# Patient Record
Sex: Male | Born: 1970 | Race: White | Hispanic: No | Marital: Single | Smoking: Current every day smoker
Health system: Southern US, Community
[De-identification: ages and names within clinical notes are randomized; demographics above are authoritative.]

## PROBLEM LIST (undated history)

## (undated) DIAGNOSIS — K900 Celiac disease: Secondary | ICD-10-CM

## (undated) DIAGNOSIS — E785 Hyperlipidemia, unspecified: Secondary | ICD-10-CM

## (undated) DIAGNOSIS — E119 Type 2 diabetes mellitus without complications: Secondary | ICD-10-CM

## (undated) HISTORY — DX: Hyperlipidemia, unspecified: E78.5

## (undated) HISTORY — DX: Type 2 diabetes mellitus without complications: E11.9

## (undated) HISTORY — PX: CERVICAL DISC SURGERY: SHX588

## (undated) HISTORY — DX: Celiac disease: K90.0

---

## 2005-04-17 ENCOUNTER — Emergency Department: Payer: Self-pay | Admitting: Emergency Medicine

## 2005-05-15 ENCOUNTER — Ambulatory Visit (HOSPITAL_COMMUNITY): Admission: RE | Admit: 2005-05-15 | Discharge: 2005-05-16 | Payer: Self-pay | Admitting: Neurosurgery

## 2005-06-10 ENCOUNTER — Encounter: Admission: RE | Admit: 2005-06-10 | Discharge: 2005-06-10 | Payer: Self-pay | Admitting: Neurosurgery

## 2012-05-26 ENCOUNTER — Ambulatory Visit: Payer: Self-pay | Admitting: Family Medicine

## 2012-06-24 ENCOUNTER — Ambulatory Visit: Payer: Self-pay | Admitting: Family Medicine

## 2013-09-23 ENCOUNTER — Ambulatory Visit: Payer: Self-pay | Admitting: Family Medicine

## 2015-05-31 DIAGNOSIS — E785 Hyperlipidemia, unspecified: Secondary | ICD-10-CM | POA: Insufficient documentation

## 2015-05-31 DIAGNOSIS — E109 Type 1 diabetes mellitus without complications: Secondary | ICD-10-CM | POA: Insufficient documentation

## 2015-05-31 DIAGNOSIS — K900 Celiac disease: Secondary | ICD-10-CM | POA: Insufficient documentation

## 2015-06-04 ENCOUNTER — Encounter: Payer: Self-pay | Admitting: Family Medicine

## 2015-06-04 ENCOUNTER — Ambulatory Visit (INDEPENDENT_AMBULATORY_CARE_PROVIDER_SITE_OTHER): Payer: BLUE CROSS/BLUE SHIELD | Admitting: Family Medicine

## 2015-06-04 VITALS — BP 107/75 | HR 66 | Temp 98.0°F | Ht 71.1 in | Wt 161.0 lb

## 2015-06-04 DIAGNOSIS — K900 Celiac disease: Secondary | ICD-10-CM | POA: Diagnosis not present

## 2015-06-04 DIAGNOSIS — E784 Other hyperlipidemia: Secondary | ICD-10-CM

## 2015-06-04 DIAGNOSIS — E7849 Other hyperlipidemia: Secondary | ICD-10-CM

## 2015-06-04 DIAGNOSIS — E785 Hyperlipidemia, unspecified: Secondary | ICD-10-CM

## 2015-06-04 DIAGNOSIS — E109 Type 1 diabetes mellitus without complications: Secondary | ICD-10-CM

## 2015-06-04 LAB — LP+ALT+AST PICCOLO, WAIVED
ALT (SGPT) Piccolo, Waived: 16 U/L (ref 10–47)
AST (SGOT) Piccolo, Waived: 28 U/L (ref 11–38)
CHOL/HDL RATIO PICCOLO,WAIVE: 3.5 mg/dL
CHOLESTEROL PICCOLO, WAIVED: 145 mg/dL (ref <200)
HDL Chol Piccolo, Waived: 41 mg/dL — ABNORMAL LOW (ref >59)
LDL Chol Calc Piccolo Waived: 78 mg/dL (ref <100)
Triglycerides Piccolo,Waived: 128 mg/dL (ref <150)
VLDL CHOL CALC PICCOLO,WAIVE: 26 mg/dL (ref <30)

## 2015-06-04 LAB — BAYER DCA HB A1C WAIVED: HB A1C (BAYER DCA - WAIVED): 7.3 % — ABNORMAL HIGH (ref <7.0)

## 2015-06-04 MED ORDER — INSULIN ASPART 100 UNIT/ML FLEXPEN: 20.0000 [IU] | PEN_INJECTOR | Freq: Three times a day (TID) | SUBCUTANEOUS | Status: DC

## 2015-06-04 MED ORDER — INSULIN GLARGINE 300 UNIT/ML ~~LOC~~ SOPN: 25.0000 [IU] | PEN_INJECTOR | Freq: Every day | SUBCUTANEOUS | Status: DC | PRN

## 2015-06-04 NOTE — Assessment & Plan Note (Signed)
Diet controled

## 2015-06-04 NOTE — Progress Notes (Signed)
   BP 107/75 mmHg  Pulse 66  Temp(Src) 98 F (36.7 C)  Ht 5' 11.1" (1.806 m)  Wt 161 lb (73.029 kg)  BMI 22.39 kg/m2  SpO2 97%   Subjective:    Patient ID: Dustin Ray, male    DOB: August 08, 1971, 44 y.o.   MRN: [id number]  HPI: Dustin Ray is a 44 y.o. male  Chief Complaint  Patient presents with  . Diabetes  . Hyperlipidemia  doing well No low BS issues Taking meds well No side effects Lipids doing well Also wants DMV papers done Reviewed and has been taken off medical review.  Relevant past medical, surgical, family and social history reviewed and updated as indicated. Interim medical history since our last visit reviewed. Allergies and medications reviewed and updated.  Review of Systems  Constitutional: Negative.   Respiratory: Negative.   Cardiovascular: Negative.     Per HPI unless specifically indicated above     Objective:    BP 107/75 mmHg  Pulse 66  Temp(Src) 98 F (36.7 C)  Ht 5' 11.1" (1.806 m)  Wt 161 lb (73.029 kg)  BMI 22.39 kg/m2  SpO2 97%  Wt Readings from Last 3 Encounters:  06/04/15 161 lb (73.029 kg)  03/01/15 151 lb (68.493 kg)    Physical Exam  Constitutional: He is oriented to person, place, and time. He appears well-developed and well-nourished. No distress.  HENT:  Head: Normocephalic and atraumatic.  Right Ear: Hearing normal.  Left Ear: Hearing normal.  Nose: Nose normal.  Eyes: Conjunctivae and lids are normal. Right eye exhibits no discharge. Left eye exhibits no discharge. No scleral icterus.  Cardiovascular: Normal rate, regular rhythm and normal heart sounds.   Pulmonary/Chest: Effort normal and breath sounds normal. No respiratory distress.  Musculoskeletal: Normal range of motion.  Neurological: He is alert and oriented to person, place, and time.  Skin: Skin is intact. No rash noted.  Psychiatric: He has a normal mood and affect. His speech is normal and behavior is normal. Judgment and thought content normal.  Cognition and memory are normal.    No results found for this or any previous visit.    Assessment & Plan:   Problem List Items Addressed This Visit      Digestive   Celiac disease    The current medical regimen is effective;  continue present plan and medications.         Endocrine   Diabetes - Primary    The current medical regimen is effective;  continue present plan and medications. Cont diet and exercise      Relevant Medications   insulin aspart (NOVOLOG FLEXPEN) 100 UNIT/ML FlexPen   Insulin Glargine (TOUJEO SOLOSTAR) 300 UNIT/ML SOPN   Other Relevant Orders   Bayer DCA Hb A1c Waived   Basic metabolic panel   LP+ALT+AST Piccolo, Waived     Other   Hyperlipidemia    Diet controled       Other Visit Diagnoses    Familial combined hyperlipidemia        Relevant Orders    Bayer DCA Hb A1c Waived    Basic metabolic panel    LP+ALT+AST Piccolo, Waived        Follow up plan: Return in about 3 months (around 09/04/2015) for check A1C.

## 2015-06-04 NOTE — Assessment & Plan Note (Signed)
The current medical regimen is effective;  continue present plan and medications.  

## 2015-06-04 NOTE — Assessment & Plan Note (Signed)
The current medical regimen is effective;  continue present plan and medications. Cont diet and exercise

## 2015-06-05 LAB — BASIC METABOLIC PANEL
BUN/Creatinine Ratio: 18 (ref 9–20)
BUN: 19 mg/dL (ref 6–24)
CO2: 28 mmol/L (ref 18–29)
Calcium: 9.5 mg/dL (ref 8.7–10.2)
Chloride: 102 mmol/L (ref 97–108)
Creatinine, Ser: 1.08 mg/dL (ref 0.76–1.27)
GFR calc Af Amer: 95 mL/min/{1.73_m2} (ref >59)
GFR calc non Af Amer: 82 mL/min/{1.73_m2} (ref >59)
Glucose: 52 mg/dL — ABNORMAL LOW (ref 65–99)
Potassium: 3.7 mmol/L (ref 3.5–5.2)
Sodium: 144 mmol/L (ref 134–144)

## 2015-07-16 ENCOUNTER — Other Ambulatory Visit: Payer: Self-pay | Admitting: Family Medicine

## 2015-09-03 ENCOUNTER — Ambulatory Visit (INDEPENDENT_AMBULATORY_CARE_PROVIDER_SITE_OTHER): Payer: BLUE CROSS/BLUE SHIELD | Admitting: Family Medicine

## 2015-09-03 ENCOUNTER — Encounter: Payer: Self-pay | Admitting: Family Medicine

## 2015-09-03 VITALS — BP 112/69 | HR 65 | Temp 97.6°F | Ht 71.0 in | Wt 160.0 lb

## 2015-09-03 DIAGNOSIS — E785 Hyperlipidemia, unspecified: Secondary | ICD-10-CM | POA: Diagnosis not present

## 2015-09-03 DIAGNOSIS — J019 Acute sinusitis, unspecified: Secondary | ICD-10-CM | POA: Diagnosis not present

## 2015-09-03 DIAGNOSIS — E109 Type 1 diabetes mellitus without complications: Secondary | ICD-10-CM

## 2015-09-03 DIAGNOSIS — K900 Celiac disease: Secondary | ICD-10-CM | POA: Diagnosis not present

## 2015-09-03 DIAGNOSIS — J329 Chronic sinusitis, unspecified: Secondary | ICD-10-CM | POA: Insufficient documentation

## 2015-09-03 LAB — BAYER DCA HB A1C WAIVED: HB A1C: 7.6 % — AB (ref <7.0)

## 2015-09-03 MED ORDER — AMOXICILLIN 875 MG PO TABS: 875.0000 mg | ORAL_TABLET | Freq: Two times a day (BID) | ORAL | Status: DC

## 2015-09-03 NOTE — Assessment & Plan Note (Signed)
The current medical regimen is effective;  continue present plan and medications.  

## 2015-09-03 NOTE — Progress Notes (Signed)
BP 112/69 mmHg  Pulse 65  Temp(Src) 97.6 F (36.4 C)  Ht 5' 11"  (1.803 m)  Wt 160 lb (72.576 kg)  BMI 22.33 kg/m2  SpO2 97%   Subjective:    Patient ID: Dustin Ray, male    DOB: February 04, 1971, 44 y.o.   MRN: [id number]  HPI: Dustin Ray is a 44 y.o. male  Chief Complaint  Patient presents with  . Diabetes  . URI   patient's diabetes has been doing okay except for the last 2 weeks with this upper respiratory infection and sinus congestion and drainage and facial pressure no fevers. His been getting worse. Has been taking over-the-counter medications without help. Patient didn't already have this visit he would come in anyway.  Other medical illnesses stable  Colitis and hypercholesterol doing well.  Relevant past medical, surgical, family and social history reviewed and updated as indicated. Interim medical history since our last visit reviewed. Allergies and medications reviewed and updated.  Review of Systems  Constitutional: Positive for fatigue. Negative for fever.  HENT: Positive for congestion, facial swelling, rhinorrhea, sinus pressure, sneezing and sore throat.   Respiratory: Positive for cough.   Cardiovascular: Negative.   Gastrointestinal: Negative.     Per HPI unless specifically indicated above     Objective:    BP 112/69 mmHg  Pulse 65  Temp(Src) 97.6 F (36.4 C)  Ht 5' 11"  (1.803 m)  Wt 160 lb (72.576 kg)  BMI 22.33 kg/m2  SpO2 97%  Wt Readings from Last 3 Encounters:  09/03/15 160 lb (72.576 kg)  06/04/15 161 lb (73.029 kg)  03/01/15 151 lb (68.493 kg)    Physical Exam  Constitutional: He is oriented to person, place, and time. He appears well-developed and well-nourished. No distress.  HENT:  Head: Normocephalic and atraumatic.  Right Ear: Hearing and external ear normal.  Left Ear: Hearing and external ear normal.  Nose: Nose normal.  Mouth/Throat: Oropharyngeal exudate present.  Throat inflamed with sinusitis drainage  Eyes:  Conjunctivae and lids are normal. Right eye exhibits no discharge. Left eye exhibits no discharge. No scleral icterus.  Cardiovascular: Normal rate, regular rhythm and normal heart sounds.   Pulmonary/Chest: Effort normal and breath sounds normal. No respiratory distress.  Musculoskeletal: Normal range of motion.  Lymphadenopathy:    He has no cervical adenopathy.  Neurological: He is alert and oriented to person, place, and time.  Skin: Skin is intact. No rash noted.  Psychiatric: He has a normal mood and affect. His speech is normal and behavior is normal. Judgment and thought content normal. Cognition and memory are normal.    Results for orders placed or performed in visit on 06/04/15  Bayer DCA Hb A1c Waived  Result Value Ref Range   Bayer DCA Hb A1c Waived 7.3 (H) <4.9 %  Basic metabolic panel  Result Value Ref Range   Glucose 52 (L) 65 - 99 mg/dL   BUN 19 6 - 24 mg/dL   Creatinine, Ser 1.08 0.76 - 1.27 mg/dL   GFR calc non Af Amer 82 >59 mL/min/1.73   GFR calc Af Amer 95 >59 mL/min/1.73   BUN/Creatinine Ratio 18 9 - 20   Sodium 144 134 - 144 mmol/L   Potassium 3.7 3.5 - 5.2 mmol/L   Chloride 102 97 - 108 mmol/L   CO2 28 18 - 29 mmol/L   Calcium 9.5 8.7 - 10.2 mg/dL  LP+ALT+AST Piccolo, Waived  Result Value Ref Range   ALT (SGPT) Piccolo, [employer]  16 10 - 47 U/L   AST (SGOT) Piccolo, Waived 28 11 - 38 U/L   Cholesterol Piccolo, Waived 145 <200 mg/dL   HDL Chol Piccolo, Waived 41 (L) >59 mg/dL   Triglycerides Piccolo,Waived 128 <150 mg/dL   Chol/HDL Ratio Piccolo,Waive 3.5 mg/dL   LDL Chol Calc Piccolo Waived 78 <100 mg/dL   VLDL Chol Calc Piccolo,Waive 26 <30 mg/dL      Assessment & Plan:   Problem List Items Addressed This Visit      Respiratory   Sinusitis   Relevant Medications   amoxicillin (AMOXIL) 875 MG tablet     Digestive   Celiac disease    The current medical regimen is effective;  continue present plan and medications.         Endocrine   DM  (diabetes mellitus), type 1 ([place]) - Primary    The current medical regimen is effective;  continue present plan and medications. Discussed need to do a little better. Patient aware of adjusting insulin and doing better with diet.       Relevant Orders   Bayer DCA Hb A1c Waived     Other   Hyperlipidemia    The current medical regimen is effective;  continue present plan and medications.           Follow up plan: Return in about 3 months (around 12/04/2015) for Physical Exam and hemoglobin A1c.

## 2015-09-03 NOTE — Assessment & Plan Note (Addendum)
The current medical regimen is effective;  continue present plan and medications. Discussed need to do a little better. Patient aware of adjusting insulin and doing better with diet.

## 2015-09-19 ENCOUNTER — Telehealth: Payer: Self-pay | Admitting: Family Medicine

## 2015-09-19 MED ORDER — AZITHROMYCIN 250 MG PO TABS: ORAL_TABLET | ORAL | Status: DC

## 2015-09-19 NOTE — Telephone Encounter (Signed)
Pt says his nasal infection isn't going away and he would like to know if he could get something else called in to [facility] [place]

## 2015-10-24 ENCOUNTER — Telehealth: Payer: Self-pay

## 2015-10-24 ENCOUNTER — Other Ambulatory Visit: Payer: Self-pay | Admitting: Family Medicine

## 2015-10-24 MED ORDER — AZITHROMYCIN 250 MG PO TABS: ORAL_TABLET | ORAL | Status: DC

## 2015-10-24 NOTE — Telephone Encounter (Signed)
Patient still has sinus infection and would like another ABX called to [facility] in [place]

## 2015-12-26 ENCOUNTER — Encounter: Payer: Self-pay | Admitting: Family Medicine

## 2015-12-26 ENCOUNTER — Ambulatory Visit (INDEPENDENT_AMBULATORY_CARE_PROVIDER_SITE_OTHER): Payer: BLUE CROSS/BLUE SHIELD | Admitting: Family Medicine

## 2015-12-26 VITALS — BP 108/72 | HR 67 | Temp 98.6°F | Ht 71.0 in | Wt 159.0 lb

## 2015-12-26 DIAGNOSIS — E119 Type 2 diabetes mellitus without complications: Secondary | ICD-10-CM

## 2015-12-26 DIAGNOSIS — E10638 Type 1 diabetes mellitus with other oral complications: Secondary | ICD-10-CM

## 2015-12-26 DIAGNOSIS — Z0000 Encounter for general adult medical examination without abnormal findings: Secondary | ICD-10-CM

## 2015-12-26 DIAGNOSIS — Z113 Encounter for screening for infections with a predominantly sexual mode of transmission: Secondary | ICD-10-CM

## 2015-12-26 DIAGNOSIS — J019 Acute sinusitis, unspecified: Secondary | ICD-10-CM

## 2015-12-26 LAB — MICROSCOPIC EXAMINATION
Epithelial Cells (non renal): NONE SEEN /hpf (ref 0–10)
WBC, UA: NONE SEEN /hpf (ref 0–?)

## 2015-12-26 LAB — URINALYSIS, ROUTINE W REFLEX MICROSCOPIC
BILIRUBIN UA: NEGATIVE
KETONES UA: NEGATIVE
Leukocytes, UA: NEGATIVE
NITRITE UA: NEGATIVE
Protein, UA: NEGATIVE
SPEC GRAV UA: 1.02 (ref 1.005–1.030)
UUROB: 0.2 mg/dL (ref 0.2–1.0)
pH, UA: 7 (ref 5.0–7.5)

## 2015-12-26 LAB — BAYER DCA HB A1C WAIVED: HB A1C (BAYER DCA - WAIVED): 8.3 % — ABNORMAL HIGH (ref <7.0)

## 2015-12-26 LAB — MICROALBUMIN, URINE WAIVED
CREATININE, URINE WAIVED: 100 mg/dL (ref 10–300)
Microalb, Ur Waived: 10 mg/L (ref 0–19)
Microalb/Creat Ratio: <30 mg/g (ref <30)

## 2015-12-26 MED ORDER — AMOXICILLIN-POT CLAVULANATE 875-125 MG PO TABS: 1.0000 | ORAL_TABLET | Freq: Two times a day (BID) | ORAL | Status: DC

## 2015-12-26 NOTE — Assessment & Plan Note (Signed)
Discuss poor control of diabetes with patient having secondary infections of dental abscess and recurrent sinusitis. Patient afternoon glucoses are elevated morning glucose is been normal Discuss increasing NovoLog Discuss referral to endocrinology for better control of diabetes will recheck 3 months if still elevated having difficulty control will refer to endocrinology

## 2015-12-26 NOTE — Progress Notes (Signed)
BP 108/72 mmHg  Pulse 67  Temp(Src) 98.6 F (37 C)  Ht 5' 11"  (1.803 m)  Wt 159 lb (72.122 kg)  BMI 22.19 kg/m2  SpO2 97%   Subjective:    Patient ID: Dustin Ray, male    DOB: 12-09-1970, 45 y.o.   MRN: [id number]  HPI: Dustin Ray is a 45 y.o. male  Chief Complaint  Patient presents with  . Annual Exam  . Sinus Problem   she all in all doing okay still has sinusitis problems feels like it never went away from this fall in October when he took Amoxil followed by 2 rounds of Z-Pak. Just had a molar #1 or #2 removed with root abscess or sinus penetration according to x-rays reported to the patient the dentist. Diabetes doing okay No low blood sugar spells doing insulin without problems Taking B12 without problems also  Relevant past medical, surgical, family and social history reviewed and updated as indicated. Interim medical history since our last visit reviewed. Allergies and medications reviewed and updated.  Review of Systems  Constitutional: Negative.   HENT: Negative.   Eyes: Negative.   Respiratory: Negative.   Cardiovascular: Negative.   Gastrointestinal: Negative.   Endocrine: Negative.   Genitourinary: Negative.   Musculoskeletal: Negative.   Skin: Negative.   Allergic/Immunologic: Negative.   Neurological: Negative.   Hematological: Negative.   Psychiatric/Behavioral: Negative.     Per HPI unless specifically indicated above     Objective:    BP 108/72 mmHg  Pulse 67  Temp(Src) 98.6 F (37 C)  Ht 5' 11"  (1.803 m)  Wt 159 lb (72.122 kg)  BMI 22.19 kg/m2  SpO2 97%  Wt Readings from Last 3 Encounters:  12/26/15 159 lb (72.122 kg)  09/03/15 160 lb (72.576 kg)  06/04/15 161 lb (73.029 kg)    Physical Exam  Constitutional: He is oriented to person, place, and time. He appears well-developed and well-nourished.  HENT:  Head: Normocephalic and atraumatic.  Right Ear: External ear normal.  Left Ear: External ear normal.  Eyes:  Conjunctivae and EOM are normal. Pupils are equal, round, and reactive to light.  Neck: Normal range of motion. Neck supple.  Cardiovascular: Normal rate, regular rhythm, normal heart sounds and intact distal pulses.   Pulmonary/Chest: Effort normal and breath sounds normal.  Abdominal: Soft. Bowel sounds are normal. There is no splenomegaly or hepatomegaly.  Genitourinary: Rectum normal, prostate normal and penis normal.  Musculoskeletal: Normal range of motion.  Neurological: He is alert and oriented to person, place, and time. He has normal reflexes.  Skin: No rash noted. No erythema.  Psychiatric: He has a normal mood and affect. His behavior is normal. Judgment and thought content normal.    Results for orders placed or performed in visit on 09/03/15  Bayer DCA Hb A1c Waived  Result Value Ref Range   Bayer DCA Hb A1c Waived 7.6 (H) <7.0 %      Assessment & Plan:   Problem List Items Addressed This Visit      Respiratory   Sinusitis    Discussed sinusitis Will do a round of Augmentin if not better afterwards refer to ear nose and throat      Relevant Medications   amoxicillin-clavulanate (AUGMENTIN) 875-125 MG tablet     Endocrine   DM (diabetes mellitus), type 1 ([place])    Discuss poor control of diabetes with patient having secondary infections of dental abscess and recurrent sinusitis. Patient afternoon glucoses are elevated  morning glucose is been normal Discuss increasing NovoLog Discuss referral to endocrinology for better control of diabetes will recheck 3 months if still elevated having difficulty control will refer to endocrinology       Other Visit Diagnoses    Routine general medical examination at a health care facility    -  Primary    Relevant Orders    CBC with Differential/Platelet    Comprehensive metabolic panel    Lipid Panel w/o Chol/HDL Ratio    PSA    TSH    Urinalysis, Routine w reflex microscopic (not at [facility])    Routine screening for STI  (sexually transmitted infection)        Relevant Orders    HIV antibody    Diabetes mellitus without complication ([place])        Relevant Orders    Microalbumin, Urine Waived    Bayer DCA Hb A1c Waived        Follow up plan: Return in about 3 months (around 03/24/2016) for A1c.

## 2015-12-26 NOTE — Assessment & Plan Note (Signed)
Discussed sinusitis Will do a round of Augmentin if not better afterwards refer to ear nose and throat

## 2015-12-27 ENCOUNTER — Encounter: Payer: Self-pay | Admitting: Family Medicine

## 2015-12-27 LAB — CBC WITH DIFFERENTIAL/PLATELET
Basophils Absolute: 0.1 10*3/uL (ref 0.0–0.2)
Basos: 1 %
EOS (ABSOLUTE): 0.4 10*3/uL (ref 0.0–0.4)
EOS: 5 %
HEMATOCRIT: 42.1 % (ref 37.5–51.0)
Hemoglobin: 14.2 g/dL (ref 12.6–17.7)
IMMATURE GRANULOCYTES: 0 %
Immature Grans (Abs): 0 10*3/uL (ref 0.0–0.1)
Lymphocytes Absolute: 2.1 10*3/uL (ref 0.7–3.1)
Lymphs: 25 %
MCH: 30.3 pg (ref 26.6–33.0)
MCHC: 33.7 g/dL (ref 31.5–35.7)
MCV: 90 fL (ref 79–97)
MONOCYTES: 9 %
MONOS ABS: 0.8 10*3/uL (ref 0.1–0.9)
NEUTROS PCT: 60 %
Neutrophils Absolute: 5.1 10*3/uL (ref 1.4–7.0)
Platelets: 317 10*3/uL (ref 150–379)
RBC: 4.68 x10E6/uL (ref 4.14–5.80)
RDW: 13.4 % (ref 12.3–15.4)
WBC: 8.4 10*3/uL (ref 3.4–10.8)

## 2015-12-27 LAB — COMPREHENSIVE METABOLIC PANEL
ALT: 11 IU/L (ref 0–44)
AST: 17 IU/L (ref 0–40)
Albumin/Globulin Ratio: 2.1 (ref 1.1–2.5)
Albumin: 4.1 g/dL (ref 3.5–5.5)
Alkaline Phosphatase: 89 IU/L (ref 39–117)
BUN/Creatinine Ratio: 23 — ABNORMAL HIGH (ref 9–20)
BUN: 24 mg/dL (ref 6–24)
Bilirubin Total: 0.2 mg/dL (ref 0.0–1.2)
CALCIUM: 9.2 mg/dL (ref 8.7–10.2)
CO2: 23 mmol/L (ref 18–29)
CREATININE: 1.05 mg/dL (ref 0.76–1.27)
Chloride: 94 mmol/L — ABNORMAL LOW (ref 96–106)
GFR calc Af Amer: 98 mL/min/{1.73_m2} (ref >59)
GFR, EST NON AFRICAN AMERICAN: 85 mL/min/{1.73_m2} (ref >59)
GLOBULIN, TOTAL: 2 g/dL (ref 1.5–4.5)
GLUCOSE: 295 mg/dL — AB (ref 65–99)
Potassium: 4.1 mmol/L (ref 3.5–5.2)
SODIUM: 135 mmol/L (ref 134–144)
Total Protein: 6.1 g/dL (ref 6.0–8.5)

## 2015-12-27 LAB — LIPID PANEL W/O CHOL/HDL RATIO
Cholesterol, Total: 136 mg/dL (ref 100–199)
HDL: 37 mg/dL — AB (ref >39)
LDL CALC: 71 mg/dL (ref 0–99)
TRIGLYCERIDES: 141 mg/dL (ref 0–149)
VLDL CHOLESTEROL CAL: 28 mg/dL (ref 5–40)

## 2015-12-27 LAB — TSH: TSH: 1.25 u[IU]/mL (ref 0.450–4.500)

## 2015-12-27 LAB — HIV ANTIBODY (ROUTINE TESTING W REFLEX): HIV Screen 4th Generation wRfx: NONREACTIVE

## 2015-12-27 LAB — PSA: PROSTATE SPECIFIC AG, SERUM: 0.5 ng/mL (ref 0.0–4.0)

## 2016-01-09 ENCOUNTER — Telehealth: Payer: Self-pay | Admitting: Family Medicine

## 2016-01-09 DIAGNOSIS — J018 Other acute sinusitis: Secondary | ICD-10-CM

## 2016-01-09 NOTE — Telephone Encounter (Signed)
Pt called stated he is still having sinus issues. Pt stated he would like a referral to a ENT. Last round of antibiotics almost cleared it up but it has come back. Please call pt with any issues. Thanks.

## 2016-01-16 ENCOUNTER — Telehealth: Payer: Self-pay

## 2016-01-16 MED ORDER — AZITHROMYCIN 250 MG PO TABS: ORAL_TABLET | ORAL | Status: DC

## 2016-01-16 NOTE — Telephone Encounter (Signed)
Still has sinus infection on right side and would like another ABX  [employer]

## 2016-02-01 ENCOUNTER — Other Ambulatory Visit: Payer: Self-pay | Admitting: Family Medicine

## 2016-03-02 ENCOUNTER — Encounter: Payer: Self-pay | Admitting: Emergency Medicine

## 2016-03-02 ENCOUNTER — Emergency Department: Payer: 59

## 2016-03-02 ENCOUNTER — Emergency Department
Admission: EM | Admit: 2016-03-02 | Discharge: 2016-03-02 | Disposition: A | Discharge status: Home / Self Care | Payer: 59 | Attending: Emergency Medicine | Admitting: Emergency Medicine

## 2016-03-02 DIAGNOSIS — S6991XA Unspecified injury of right wrist, hand and finger(s), initial encounter: Secondary | ICD-10-CM | POA: Diagnosis present

## 2016-03-02 DIAGNOSIS — S52511A Displaced fracture of right radial styloid process, initial encounter for closed fracture: Secondary | ICD-10-CM | POA: Insufficient documentation

## 2016-03-02 DIAGNOSIS — Y999 Unspecified external cause status: Secondary | ICD-10-CM | POA: Diagnosis not present

## 2016-03-02 DIAGNOSIS — Y939 Activity, unspecified: Secondary | ICD-10-CM | POA: Diagnosis not present

## 2016-03-02 DIAGNOSIS — W11XXXA Fall on and from ladder, initial encounter: Secondary | ICD-10-CM | POA: Diagnosis not present

## 2016-03-02 DIAGNOSIS — Z794 Long term (current) use of insulin: Secondary | ICD-10-CM | POA: Insufficient documentation

## 2016-03-02 DIAGNOSIS — F17210 Nicotine dependence, cigarettes, uncomplicated: Secondary | ICD-10-CM | POA: Insufficient documentation

## 2016-03-02 DIAGNOSIS — Z792 Long term (current) use of antibiotics: Secondary | ICD-10-CM | POA: Diagnosis not present

## 2016-03-02 DIAGNOSIS — E119 Type 2 diabetes mellitus without complications: Secondary | ICD-10-CM | POA: Diagnosis not present

## 2016-03-02 DIAGNOSIS — S52501A Unspecified fracture of the lower end of right radius, initial encounter for closed fracture: Secondary | ICD-10-CM

## 2016-03-02 DIAGNOSIS — Z79899 Other long term (current) drug therapy: Secondary | ICD-10-CM | POA: Diagnosis not present

## 2016-03-02 DIAGNOSIS — E785 Hyperlipidemia, unspecified: Secondary | ICD-10-CM | POA: Diagnosis not present

## 2016-03-02 DIAGNOSIS — Y929 Unspecified place or not applicable: Secondary | ICD-10-CM | POA: Insufficient documentation

## 2016-03-02 LAB — GLUCOSE, CAPILLARY: Glucose-Capillary: 195 mg/dL — ABNORMAL HIGH (ref 65–99)

## 2016-03-02 MED ORDER — OXYCODONE-ACETAMINOPHEN 5-325 MG PO TABS: 1.0000 | ORAL_TABLET | Freq: Four times a day (QID) | ORAL | Status: DC | PRN

## 2016-03-02 MED ORDER — KETOROLAC TROMETHAMINE 30 MG/ML IJ SOLN
30.0000 mg | Freq: Once | INTRAMUSCULAR | Status: AC
  Administered 2016-03-02: 30 mg via INTRAMUSCULAR
  Filled 2016-03-02: qty 1

## 2016-03-02 NOTE — ED Provider Notes (Signed)
[facility] Emergency Department Provider Note  ____________________________________________  Time seen: 7:30 PM  I have reviewed the triage vital signs and the nursing notes.   HISTORY  Chief Complaint Wrist Injury    HPI Dustin Ray is a 45 y.o. male who complains of right wrist pain after falling from about 5 feet in the air off a ladder today. Fall was mechanical in nature as he lost balance and the ladder fell from under him. Fell onto his right hand trying to catch himself. Denies any other injuries and no head trauma or loss of consciousness. No numbness tingling or weakness in the fingers but does have pain and swelling around the right wrist. Also feels like distal radius area is unstable he pushes on it. Pain is worse with movement. 9 out of 10.    Past Medical History  Diagnosis Date  . Hyperlipidemia   . Celiac disease   . Diabetes mellitus without complication [facility])      Patient Active Problem List   Diagnosis Date Noted  . Sinusitis 09/03/2015  . DM (diabetes mellitus), type 1 ([place]) 05/31/2015  . Hyperlipidemia 05/31/2015  . Celiac disease 05/31/2015     Past Surgical History  Procedure Laterality Date  . Cervical disc surgery       Current Outpatient Rx  Name  Route  Sig  Dispense  Refill  . amoxicillin-clavulanate (AUGMENTIN) 875-125 MG tablet   Oral   Take 1 tablet by mouth 2 (two) times daily.   20 tablet   0   . azithromycin (ZITHROMAX) 250 MG tablet      2 now then 1 a day   6 tablet   0   . B-D ULTRAFINE III SHORT PEN 31G X 8 MM MISC      USE WITH INSULIN PENS AS DIRECTED.   100 each   12     Dx E11.9   . cholecalciferol (VITAMIN D) 1000 UNITS tablet   Oral   Take 1,000 Units by mouth daily.         . insulin aspart (NOVOLOG FLEXPEN) 100 UNIT/ML FlexPen   Subcutaneous   Inject 20 Units into the skin 3 (three) times daily with meals. Pt adjusts insulin to meals 10-30u   5 pen   12   . Insulin  Pen Needle (NOVOFINE) 30G X 8 MM MISC   Subcutaneous   Inject 1 packet into the skin as needed.         . Multiple Vitamins-Minerals (DAILY MULTIVITAMIN PO)   Oral   Take by mouth daily.         Marland Kitchen oxyCODONE-acetaminophen (ROXICET) 5-325 MG tablet   Oral   Take 1 tablet by mouth every 6 (six) hours as needed for severe pain.   20 tablet   0   . TOUJEO SOLOSTAR 300 UNIT/ML SOPN      INJECT 26 TO 36 UNITS SUBCUTANEOUSLY EVERY DAY AS DIRECTED   5 pen   12   . vitamin B-12 (CYANOCOBALAMIN) 1000 MCG tablet   Oral   Take 1,000 mcg by mouth daily.            Allergies Review of patient's allergies indicates no known allergies.   Family History  Problem Relation Age of Onset  . Diabetes Mother   . Cancer Father     Social History Social History  Substance Use Topics  . Smoking status: Current Every Day Smoker -- 1.00 packs/day    Types:  Cigarettes  . Smokeless tobacco: Never Used  . Alcohol Use: No    Review of Systems  Constitutional:   No fever or chills. Sweating. Concerned his blood sugar may be low Eyes:   No vision changes.  ENT:   No sore throat. No rhinorrhea. Cardiovascular:   No chest pain. Respiratory:   No dyspnea or cough. Gastrointestinal:   Negative for abdominal pain, vomiting and diarrhea.  No bloody stool. Genitourinary:   Negative for dysuria or difficulty urinating. Musculoskeletal:   Right wrist pain as above Neurological:   Negative for headaches 10-point ROS otherwise negative.  ____________________________________________   PHYSICAL EXAM:  VITAL SIGNS: ED Triage Vitals  Enc Vitals Group     BP 03/02/16 1930 79/49 mmHg     Pulse Rate 03/02/16 1925 65     Resp 03/02/16 1925 20     Temp 03/02/16 1925 97.5 F (36.4 C)     Temp Source 03/02/16 1925 Oral     SpO2 03/02/16 1925 99 %     Weight 03/02/16 1925 156 lb (70.761 kg)     Height 03/02/16 1925 6' (1.829 m)     Head Cir --      Peak Flow --      Pain Score 03/02/16 1927  9     Pain Loc --      Pain Edu? --      Excl. in [place]? --     Vital signs reviewed, nursing assessments reviewed.   Constitutional:   Alert and oriented. Well appearing and in no distress. Eyes:   No scleral icterus. No conjunctival pallor. PERRL. EOMI ENT   Head:   Normocephalic and atraumatic.   Nose:   No congestion/rhinnorhea. No septal hematoma   Mouth/Throat:   MMM, no pharyngeal erythema. No peritonsillar mass.    Neck:   No stridor. No SubQ emphysema. No meningismus.No midline spinal tenderness. Full range of motion. Hematological/Lymphatic/Immunilogical:   No cervical lymphadenopathy. Cardiovascular:   RRR. Symmetric bilateral radial and DP pulses.  No murmurs. Good capillary refill of all fingers. Respiratory:   Normal respiratory effort without tachypnea nor retractions. Breath sounds are clear and equal bilaterally. No wheezes/rales/rhonchi. Gastrointestinal:   Soft and nontender. Non distended. There is no CVA tenderness.  No rebound, rigidity, or guarding. Genitourinary:   deferred Musculoskeletal:  Right wrist swelling with tenderness in the snuffbox and over the distal radius. No pain with axial loading of the thumb. Range of motion intact with all fingers. Pain with movement of the wrist. Neurologic:   Normal speech and language.  CN 2-10 normal. Motor grossly intact. No gross focal neurologic deficits are appreciated.  Skin:    Skin is warm, dry and intact. No rash noted.  No petechiae, purpura, or bullae.  ____________________________________________    LABS (pertinent positives/negatives) (all labs ordered are listed, but only abnormal results are displayed) Labs Reviewed  GLUCOSE, CAPILLARY - Abnormal; Notable for the following:    Glucose-Capillary 195 (*)    All other components within normal limits   ____________________________________________   EKG    ____________________________________________    RADIOLOGY  X-ray right wrist  shows a comminuted mildly displaced distal radius fracture involving the articular surface.  ____________________________________________   PROCEDURES SPLINT APPLICATION Date/Time: 2:99 PM Authorized by: Carrie Mew Consent: Verbal consent obtained. Risks and benefits: risks, benefits and alternatives were discussed Consent given by: patient Splint applied by: Myself and orthopedic technician Location details: Right wrist  Splint type: Right thumb spica, extend  at the proximal forearm.  Supplies used: Ortho-Glass, Webb roll padding  Post-procedure: The splinted body part was neurovascularly unchanged following the procedure. Patient tolerance: Patient tolerated the procedure well with no immediate complications.     ____________________________________________   INITIAL IMPRESSION / ASSESSMENT AND PLAN / ED COURSE  Pertinent labs & imaging results that were available during my care of the patient were reviewed by me and considered in my medical decision making (see chart for details).  Patient presents with right wrist pain and swelling after a fall on outstretched hand. Clinically is consistent with a distal radius fracture. We'll get x-ray of the wrist to evaluate. Patient's initial blood pressure is about 80/50 and the patient's diaphoretic. Low sugar is 195, satting this is likely a vagal reaction due to severe pain and shock of the acute injury.  ----------------------------------------- 8:28 PM on 03/02/2016 ----------------------------------------- Blood pressure stable, vitals normal. Tolerating oral intake. Diaphoresis resolved. Patient calm and comfortable except for the wrist pain. He is driving himself home from the ED so I ordered him IM Toradol and we'll write him a prescription for Percocet to take when he is at home.  Long thumb spica splint applied, follow-up with orthopedics in a week. No evidence of compartment syndrome or neurologic deficit at this time.  Intact perfusion of the distal extremity.      ____________________________________________   FINAL CLINICAL IMPRESSION(S) / ED DIAGNOSES  Final diagnoses:  Distal radius fracture, right, closed, initial encounter       Portions of this note were generated with dragon dictation software. Dictation errors may occur despite best attempts at proofreading.   Carrie Mew, MD 03/02/16 2029

## 2016-03-02 NOTE — ED Notes (Signed)
Patient transported to x-ray. ?

## 2016-03-02 NOTE — ED Notes (Signed)
Patient presents to ED with right wrist deformity. Patient was standing on a 6 foot step latter. The latter fell sideways and the patient attempted to catch himself from the fall. Patient is A&O x4. Patient c/o 9/10 pain.  Pulses and sensation present bilaterally.

## 2016-03-02 NOTE — Discharge Instructions (Signed)
You were prescribed a medication that is potentially sedating. Do not drink alcohol, drive or participate in any other potentially dangerous activities while taking this medication as it may make you sleepy. Do not take this medication with any other sedating medications, either prescription or over-the-counter. If you were prescribed Percocet or Vicodin, do not take these with acetaminophen (Tylenol) as it is already contained within these medications.   Opioid pain medications (or "narcotics") can be habit forming.  Use it as little as possible to achieve adequate pain control.  Do not use or use it with extreme caution if you have a history of opiate abuse or dependence.  If you are on a pain contract with your primary care doctor or a pain specialist, be sure to let them know you were prescribed this medication today from the [facility] Emergency Department.  This medication is intended for your use only - do not give any to anyone else and keep it in a secure place where nobody else, especially children and pets, have access to it.  It will also cause or worsen constipation, so you may want to consider taking an over-the-counter stool softener while you are taking this medication.  Cast or Splint Care Casts and splints support injured limbs and keep bones from moving while they heal. It is important to care for your cast or splint at home.  HOME CARE INSTRUCTIONS  Keep the cast or splint uncovered during the drying period. It can take 24 to 48 hours to dry if it is made of plaster. A fiberglass cast will dry in less than 1 hour.  Do not rest the cast on anything harder than a pillow for the first 24 hours.  Do not put weight on your injured limb or apply pressure to the cast until your health care provider gives you permission.  Keep the cast or splint dry. Wet casts or splints can lose their shape and may not support the limb as well. A wet cast that has lost its shape can also create  harmful pressure on your skin when it dries. Also, wet skin can become infected.  Cover the cast or splint with a plastic bag when bathing or when out in the rain or snow. If the cast is on the trunk of the body, take sponge baths until the cast is removed.  If your cast does become wet, dry it with a towel or a blow dryer on the cool setting only.  Keep your cast or splint clean. Soiled casts may be wiped with a moistened cloth.  Do not place any hard or soft foreign objects under your cast or splint, such as cotton, toilet paper, lotion, or powder.  Do not try to scratch the skin under the cast with any object. The object could get stuck inside the cast. Also, scratching could lead to an infection. If itching is a problem, use a blow dryer on a cool setting to relieve discomfort.  Do not trim or cut your cast or remove padding from inside of it.  Exercise all joints next to the injury that are not immobilized by the cast or splint. For example, if you have a long leg cast, exercise the hip joint and toes. If you have an arm cast or splint, exercise the shoulder, elbow, thumb, and fingers.  Elevate your injured arm or leg on 1 or 2 pillows for the first 1 to 3 days to decrease swelling and pain.It is best if you can  comfortably elevate your cast so it is higher than your heart. SEEK MEDICAL CARE IF:   Your cast or splint cracks.  Your cast or splint is too tight or too loose.  You have unbearable itching inside the cast.  Your cast becomes wet or develops a soft spot or area.  You have a bad smell coming from inside your cast.  You get an object stuck under your cast.  Your skin around the cast becomes red or raw.  You have new pain or worsening pain after the cast has been applied. SEEK IMMEDIATE MEDICAL CARE IF:   You have fluid leaking through the cast.  You are unable to move your fingers or toes.  You have discolored (blue or white), cool, painful, or very swollen  fingers or toes beyond the cast.  You have tingling or numbness around the injured area.  You have severe pain or pressure under the cast.  You have any difficulty with your breathing or have shortness of breath.  You have chest pain.   This information is not intended to replace advice given to you by your health care provider. Make sure you discuss any questions you have with your health care provider.   Document Released: 11/07/2000 Document Revised: 08/31/2013 Document Reviewed: 05/19/2013 Elsevier Interactive Patient Education 2016 Elsevier Inc[employer]  Forearm Fracture A forearm fracture is a break in one or both of the bones of your arm that are between the elbow and the wrist. Your forearm is made up of two bones:  Radius. This is the bone on the inside of your arm near your thumb.  Ulna. This is the bone on the outside of your arm near your little finger. Middle forearm fractures usually break both the radius and the ulna. Most forearm fractures that involve both the ulna and radius will require surgery. CAUSES Common causes of this type of fracture include:  Falling on an outstretched arm.  Accidents, such as a car or bike accident.  A hard, direct hit to the middle part of your arm. RISK FACTORS You may be at higher risk for this type of fracture if:  You play contact sports.  You have a condition that causes your bones to be weak or thin (osteoporosis). SIGNS AND SYMPTOMS A forearm fracture causes pain immediately after the injury. Other signs and symptoms include:  An abnormal bend or bump in your arm (deformity).  Swelling.  Numbness or tingling.  Tenderness.  Inability to turn your hand from side to side (rotate).  Bruising. DIAGNOSIS Your health care provider may diagnose a forearm fracture based on:  Your symptoms.  Your medical history, including any recent injury.  A physical exam. Your health care provider will look for any deformity and feel for  tenderness over the break. Your health care provider will also check whether the bones are out of place.  An X-ray exam to confirm the diagnosis and learn more about the type of fracture. TREATMENT The goals of treatment are to get the bone or bones in proper position for healing and to keep the bones from moving so they will heal over time. Your treatment will depend on many factors, especially the type of fracture that you have.  If the fractured bone or bones:  Are in the correct position (nondisplaced), you may only need to wear a cast or a splint.  Have a slightly displaced fracture, you may need to have the bones moved back into place manually (closed reduction) before  the splint or cast is put on.  You may have a temporary splint before you have a cast. The splint allows room for some swelling. After a few days, a cast can replace the splint.  You may have to wear the cast for 6-8 weeks or as directed by your health care provider.  The cast may be changed after about 3 weeks or as directed by your health care provider.  After your cast is removed, you may need physical therapy to regain full movement in your wrist or elbow.  You may need emergency surgery if you have:  A fractured bone or bones that are out of position (displaced).  A fracture with multiple fragments (comminuted fracture).  A fracture that breaks the skin (open fracture). This type of fracture may require surgical wires, plates, or screws to hold the bone or bones in place.  You may have X-rays every couple of weeks to check on your healing. HOME CARE INSTRUCTIONS If You Have a Cast:  Do not stick anything inside the cast to scratch your skin. Doing that increases your risk of infection.  Check the skin around the cast every day. Report any concerns to your health care provider. You may put lotion on dry skin around the edges of the cast. Do not apply lotion to the skin underneath the cast. If You Have a  Splint:  Wear it as directed by your health care provider. Remove it only as directed by your health care provider.  Loosen the splint if your fingers become numb and tingle, or if they turn cold and blue. Bathing  Cover the cast or splint with a watertight plastic bag to protect it from water while you bathe or shower. Do not let the cast or splint get wet. Managing Pain, Stiffness, and Swelling  If directed, apply ice to the injured area:  Put ice in a plastic bag.  Place a towel between your skin and the bag.  Leave the ice on for 20 minutes, 2-3 times a day.  Move your fingers often to avoid stiffness and to lessen swelling.  Raise the injured area above the level of your heart while you are sitting or lying down. Driving  Do not drive or operate heavy machinery while taking pain medicine.  Do not drive while wearing a cast or splint on a hand that you use for driving. Activity  Return to your normal activities as directed by your health care provider. Ask your health care provider what activities are safe for you.  Perform range-of-motion exercises only as directed by your health care provider. Safety  Do not use your injured limb to support your body weight until your health care provider says that you can. General Instructions  Do not put pressure on any part of the cast or splint until it is fully hardened. This may take several hours.  Keep the cast or splint clean and dry.  Do not use any tobacco products, including cigarettes, chewing tobacco, or electronic cigarettes. Tobacco can delay bone healing. If you need help quitting, ask your health care provider.  Take medicines only as directed by your health care provider.  Keep all follow-up visits as directed by your health care provider. This is important. SEEK MEDICAL CARE IF:  Your pain medicine is not helping.  Your cast or splint becomes wet or damaged or suddenly feels too tight.  Your cast becomes  loose.  You have more severe pain or swelling than you did  before the cast.  You have severe pain when you stretch your fingers.  You continue to have pain or stiffness in your elbow or your wrist after your cast is removed. SEEK IMMEDIATE MEDICAL CARE IF:  You cannot move your fingers.  You lose feeling in your fingers or your hand.  Your hand or your fingers turn cold and pale or blue.  You notice a bad smell coming from your cast.  You have drainage from underneath your cast.  You have new stains from blood or drainage that is coming through your cast.   This information is not intended to replace advice given to you by your health care provider. Make sure you discuss any questions you have with your health care provider.   Document Released: 11/07/2000 Document Revised: 12/01/2014 Document Reviewed: 06/26/2014 Elsevier Interactive Patient Education [employer].

## 2016-03-26 ENCOUNTER — Encounter: Payer: Self-pay | Admitting: Family Medicine

## 2016-03-26 ENCOUNTER — Ambulatory Visit (INDEPENDENT_AMBULATORY_CARE_PROVIDER_SITE_OTHER): Payer: 59 | Admitting: Family Medicine

## 2016-03-26 VITALS — BP 106/61 | HR 71 | Temp 97.8°F | Ht 71.0 in | Wt 154.0 lb

## 2016-03-26 DIAGNOSIS — E10638 Type 1 diabetes mellitus with other oral complications: Secondary | ICD-10-CM

## 2016-03-26 DIAGNOSIS — K900 Celiac disease: Secondary | ICD-10-CM | POA: Diagnosis not present

## 2016-03-26 DIAGNOSIS — E785 Hyperlipidemia, unspecified: Secondary | ICD-10-CM

## 2016-03-26 LAB — BAYER DCA HB A1C WAIVED: HB A1C: 7.6 % — AB (ref <7.0)

## 2016-03-26 NOTE — Progress Notes (Signed)
   BP 106/61 mmHg  Pulse 71  Temp(Src) 97.8 F (36.6 C)  Ht 5' 11"  (1.803 m)  Wt 154 lb (69.854 kg)  BMI 21.49 kg/m2  SpO2 99%   Subjective:    Patient ID: Dustin Ray, male    DOB: 11-21-1971, 45 y.o.   MRN: [id number]  HPI: Dustin Ray is a 45 y.o. male  Chief Complaint  Patient presents with  . Diabetes   Patient all in all doing really well has lost weight. His back on his diet has celiac and eats gluten-free. Over the holidays patient had liberalized his diet somewhat. No low blood sugar spells no problems with insulin. Cholesterol doing well no complaints Right arms in a cast as fell off a ladder  Relevant past medical, surgical, family and social history reviewed and updated as indicated. Interim medical history since our last visit reviewed. Allergies and medications reviewed and updated.  Review of Systems  Constitutional: Negative.   Respiratory: Negative.   Cardiovascular: Negative.     Per HPI unless specifically indicated above     Objective:    BP 106/61 mmHg  Pulse 71  Temp(Src) 97.8 F (36.6 C)  Ht 5' 11"  (1.803 m)  Wt 154 lb (69.854 kg)  BMI 21.49 kg/m2  SpO2 99%  Wt Readings from Last 3 Encounters:  03/26/16 154 lb (69.854 kg)  03/02/16 156 lb (70.761 kg)  12/26/15 159 lb (72.122 kg)    Physical Exam  Constitutional: He is oriented to person, place, and time. He appears well-developed and well-nourished. No distress.  HENT:  Head: Normocephalic and atraumatic.  Right Ear: Hearing normal.  Left Ear: Hearing normal.  Nose: Nose normal.  Eyes: Conjunctivae and lids are normal. Right eye exhibits no discharge. Left eye exhibits no discharge. No scleral icterus.  Cardiovascular: Normal rate, regular rhythm and normal heart sounds.   Pulmonary/Chest: Effort normal and breath sounds normal. No respiratory distress.  Musculoskeletal: Normal range of motion.  Neurological: He is alert and oriented to person, place, and time.  Skin: Skin  is intact. No rash noted.  Psychiatric: He has a normal mood and affect. His speech is normal and behavior is normal. Judgment and thought content normal. Cognition and memory are normal.    Results for orders placed or performed during the hospital encounter of 03/02/16  Glucose, capillary  Result Value Ref Range   Glucose-Capillary 195 (H) 65 - 99 mg/dL      Assessment & Plan:   Problem List Items Addressed This Visit      Digestive   Celiac disease    The current medical regimen is effective;  continue present plan and medications.         Endocrine   DM (diabetes mellitus), type 1 ([place]) - Primary    Diabetes improving from hemoglobin A1c last time of 8.3 patient will continue working to get A1c less than 7 by adjusting insulin continuing with good diet.      Relevant Orders   Bayer DCA Hb A1c Waived     Other   Hyperlipidemia    The current medical regimen is effective;  continue present plan and medications.           Follow up plan: Return in about 3 months (around 06/26/2016) for BMP, lipids, ALT, AST, A1c.Marland Kitchen

## 2016-03-26 NOTE — Assessment & Plan Note (Signed)
The current medical regimen is effective;  continue present plan and medications.  

## 2016-03-26 NOTE — Assessment & Plan Note (Signed)
Diabetes improving from hemoglobin A1c last time of 8.3 patient will continue working to get A1c less than 7 by adjusting insulin continuing with good diet.

## 2016-04-10 ENCOUNTER — Other Ambulatory Visit: Payer: Self-pay | Admitting: Family Medicine

## 2016-04-21 IMAGING — CR DG WRIST COMPLETE 3+V*R*
4 series · 4 of 4 positions shown · non-contrast
Comparison: None.

CLINICAL DATA: Patient status post fall off of a ladder, landing on
the right hand. Visible deformity to the wrist. Initial encounter.

EXAM:
RIGHT WRIST - COMPLETE 3+ VIEW

[wrist pa]
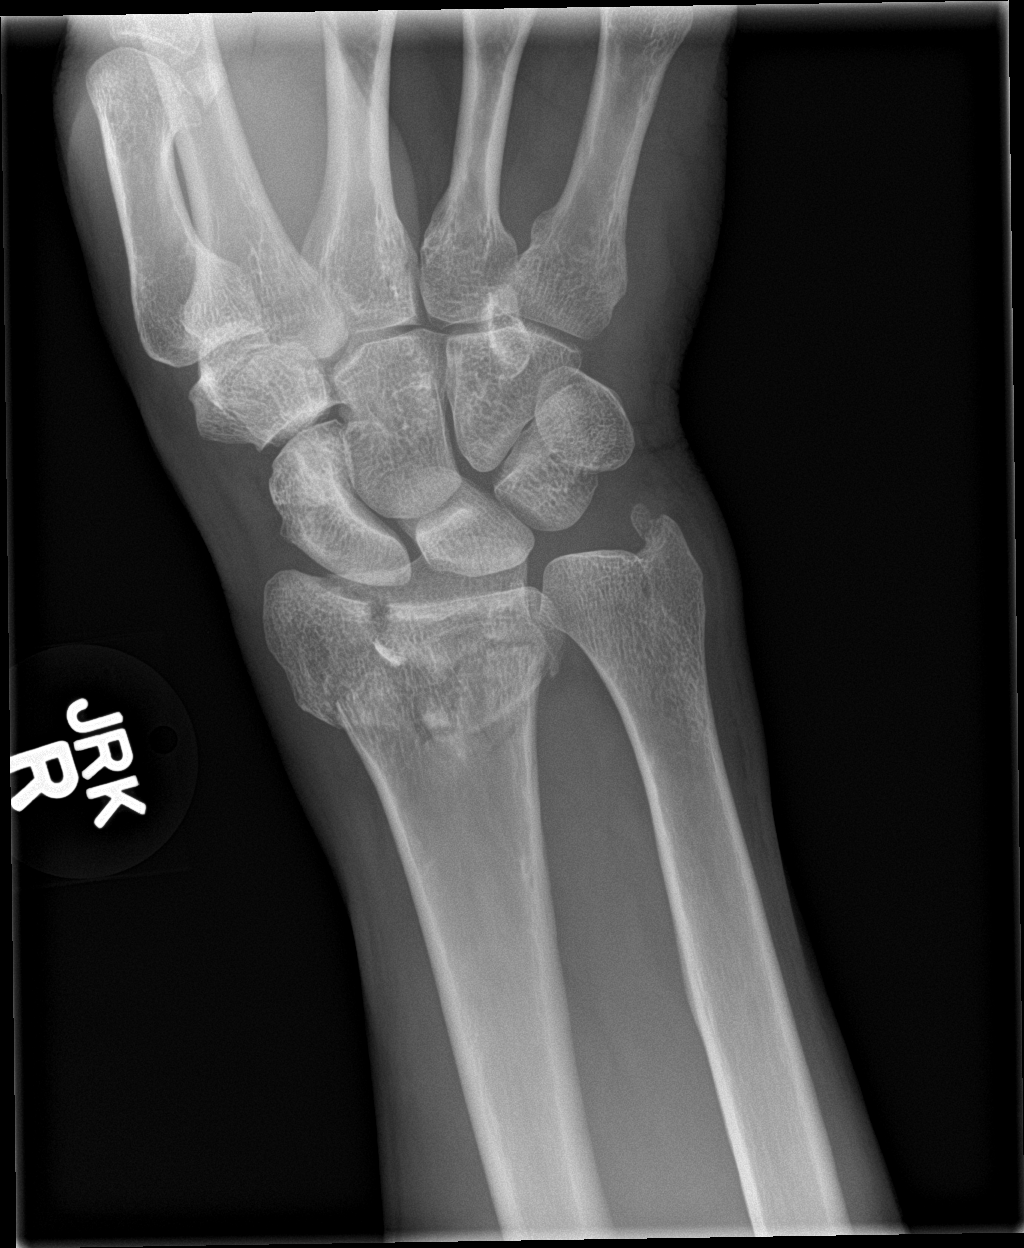

[wrist lat]
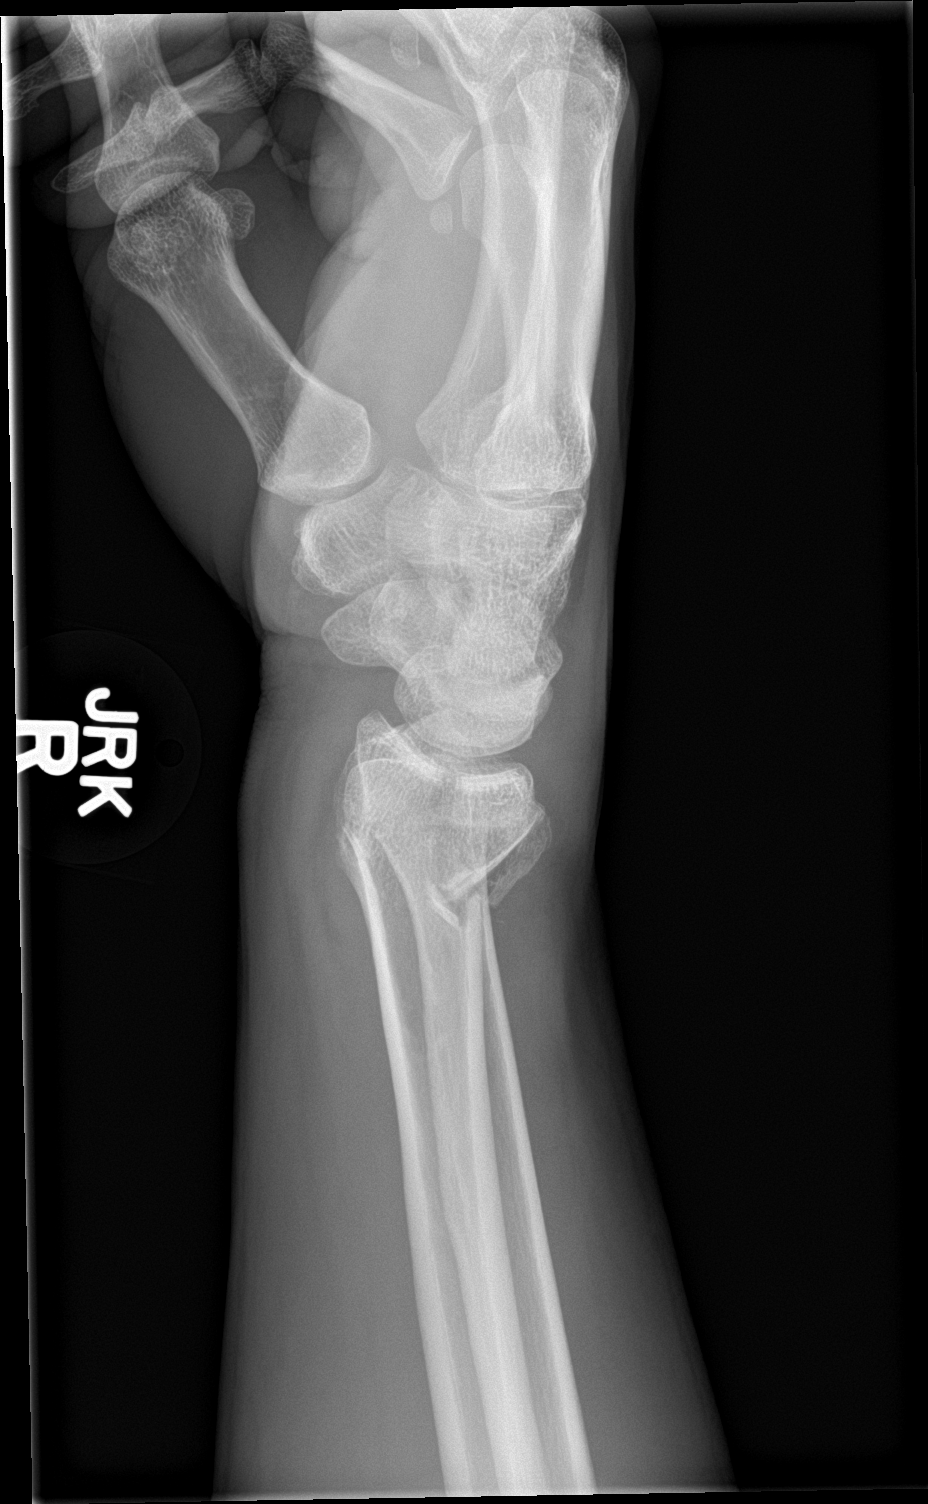

[navicular (1 of 2)]
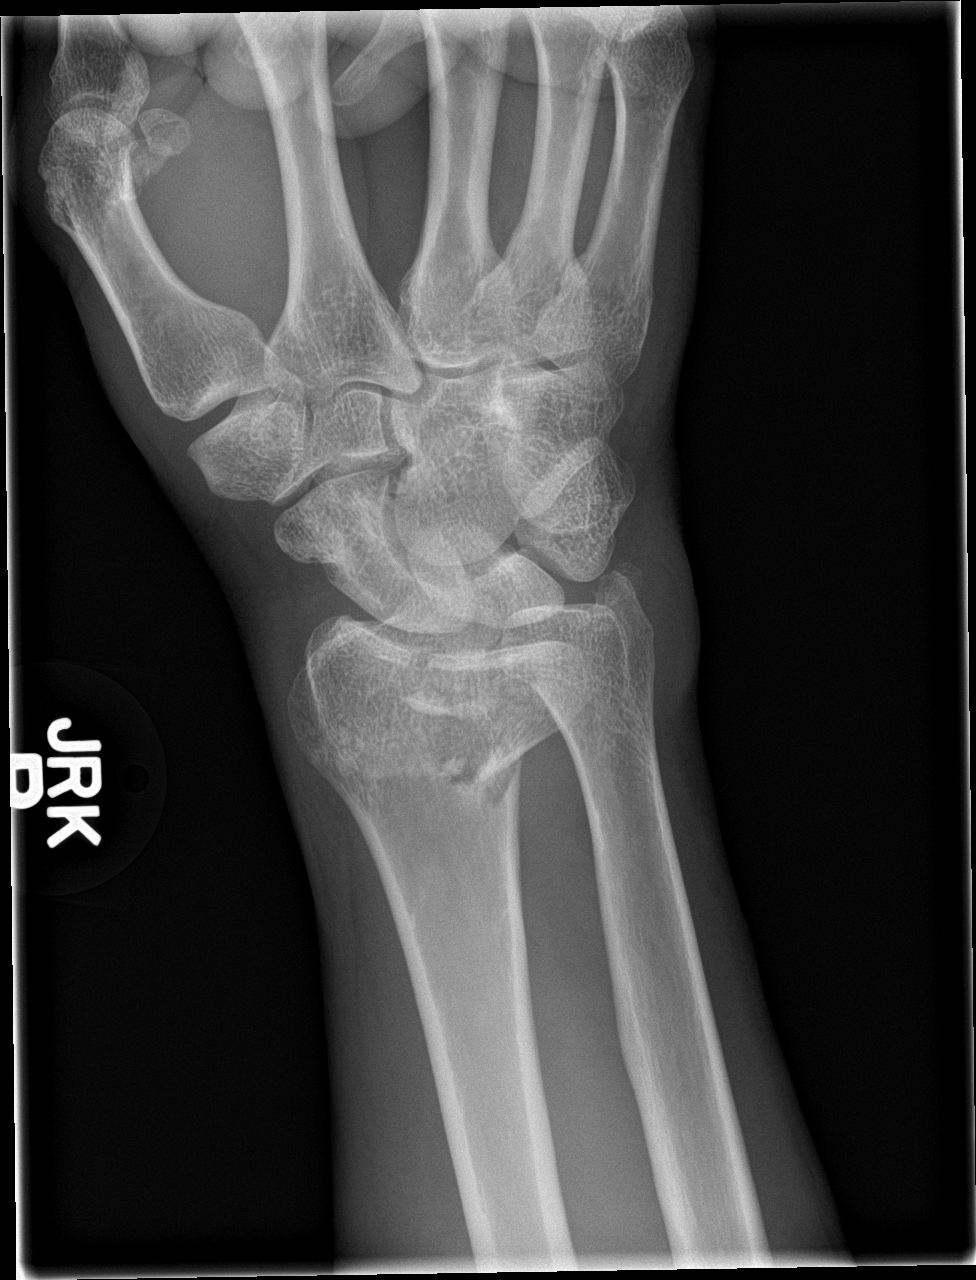

[navicular (2 of 2)]
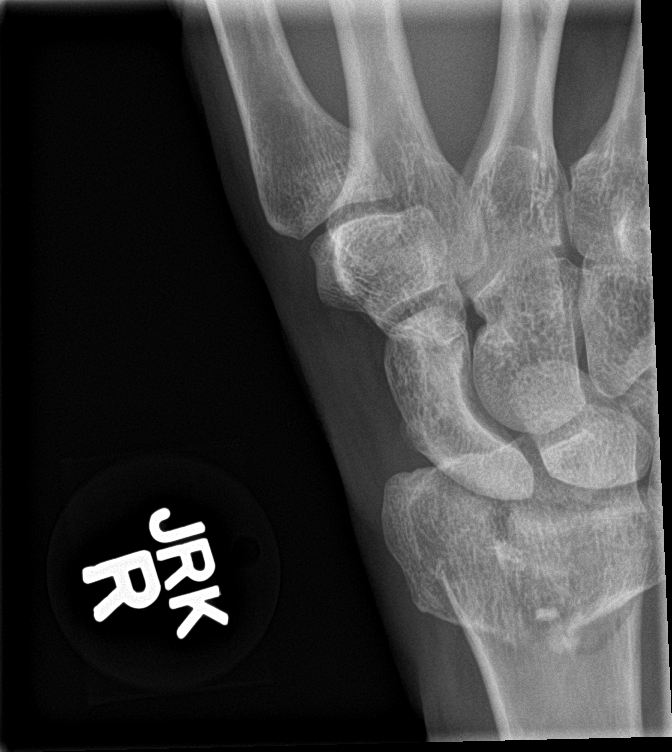

[4 of 4 positions shown; findings below may reference images not displayed]

FINDINGS: There is a markedly comminuted, impacted and displaced distal radius
fracture, involving the articular surface. Additionally there is an
age indeterminate fracture through the ulnar styloid process.
Overlying soft tissue swelling.
IMPRESSION: Comminuted impacted displaced distal radius fracture involving the
articular surface.

Age-indeterminate ulnar styloid fracture.

## 2016-06-05 ENCOUNTER — Ambulatory Visit: Payer: 59 | Attending: Specialist | Admitting: Occupational Therapy

## 2016-06-05 DIAGNOSIS — M6281 Muscle weakness (generalized): Secondary | ICD-10-CM

## 2016-06-05 DIAGNOSIS — M25631 Stiffness of right wrist, not elsewhere classified: Secondary | ICD-10-CM

## 2016-06-05 DIAGNOSIS — M25531 Pain in right wrist: Secondary | ICD-10-CM

## 2016-06-05 DIAGNOSIS — G5601 Carpal tunnel syndrome, right upper limb: Secondary | ICD-10-CM | POA: Diagnosis present

## 2016-06-05 DIAGNOSIS — M25641 Stiffness of right hand, not elsewhere classified: Secondary | ICD-10-CM | POA: Diagnosis present

## 2016-06-05 NOTE — Therapy (Signed)
[facility] [address], [place], [address] Phone: [phone]   Fax:  [phone]  Occupational Therapy Treatment  Patient Details  Name: Dustin Ray MRN: [id number] Date of Birth: 1971/10/31 Referring Provider: Sabra Heck   Encounter Date: 06/05/2016      OT End of Session - 06/05/16 1905    Visit Number 1   Number of Visits 12   Date for OT Re-Evaluation 07/17/16   OT Start Time 1346   OT Stop Time 1444   OT Time Calculation (min) 58 min   Activity Tolerance Patient tolerated treatment well   Behavior During Therapy [facility] for tasks assessed/performed      Past Medical History  Diagnosis Date  . Hyperlipidemia   . Celiac disease   . Diabetes mellitus without complication [facility])     Past Surgical History  Procedure Laterality Date  . Cervical disc surgery      There were no vitals filed for this visit.      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 -    Pain Score 4    Pain Location Wrist   Pain Orientation Right   Pain Descriptors / Indicators Aching;Shooting   Pain Onset More than a month ago            [facility] OT Assessment - 06/05/16 0001    Assessment   Diagnosis R colles fracture    Referring Provider Sabra Heck    Onset Date 03/02/16   Home  Environment   Lives With Alone   Prior Function   Vocation Full time employment   Leisure R hand domiinant , yardwork, house work , fix things , laptop    AROM   Right Forearm Pronation 60 Degrees   Right Forearm Supination 60 Degrees   Right Wrist Extension 28 Degrees   Right  Wrist Flexion 24 Degrees   Right Wrist Radial Deviation 8 Degrees   Right Wrist Ulnar Deviation 8 Degrees   Left Wrist Extension 70 Degrees   Left Wrist Flexion 74 Degrees   Left Wrist Radial Deviation 22 Degrees   Left Wrist Ulnar Deviation 25 Degrees   Strength   Right Hand Grip (lbs) 29   Right Hand Lateral Pinch 12 lbs   Right Hand 3 Point Pinch 9 lbs   Left Hand Grip (lbs) 104   Left Hand Lateral Pinch 22 lbs   Left Hand 3 Point Pinch 18 lbs   Right Hand AROM   R Thumb MCP 0-60 60 Degrees   R Thumb IP 0-80 60 Degrees   R Thumb Opposition to Index --  2n fold 5th digit   R Index  MCP 0-90 80 Degrees   R Index PIP 0-100 90 Degrees   R Long  MCP 0-90 90 Degrees   R Long PIP 0-100 95 Degrees   R Ring  MCP 0-90 90 Degrees   R Ring PIP 0-100 95 Degrees  R Little  MCP 0-90 90 Degrees   R Little PIP 0-100 95 Degrees      heat done with wrist in supination stretch  10 min  reviewed HEP - hand out provided                     OT Education - 06/05/16 1905    Education provided Yes   Education Details see pt instruction    Person(s) Educated Patient   Methods Explanation;Demonstration;Tactile cues;Verbal cues;Handout   Comprehension Verbal cues required;Returned demonstration;Verbalized understanding          OT Short Term Goals - 06/05/16 1909    OT SHORT TERM GOAL #1   Title R wrist AROM inmprove in all planes 10 -20 degrees to turn doorknob , push up from chair    Baseline see flowsheet    Time 4   Period Weeks   Status New   OT SHORT TERM GOAL #2   Title Pt report decrease in numbness and CTS in R hand doing HEP and modifications    Baseline numbness the last 3wks - before that was only tingling    Time 3   Period Weeks   Status New   OT SHORT TERM GOAL #3   Title Pt report increase ease with fisting and use of thumb during ADL's and IADL's    Baseline see flowsheet   Time 3   Period Weeks   Status New   OT SHORT TERM GOAL #4   Title Pain  on PRWHE improve with at least 20 points    Baseline pain at eval 32/50   Time 3   Period Weeks   Status New           OT Long Term Goals - 06/05/16 1913    OT LONG TERM GOAL #1   Title Grip strenght in R hand improve to  50 % compare to L to hold and carry 8 lbs or more    Baseline Grip 29 R , L 105 lbs    Time 6   Period Weeks   Status New   OT LONG TERM GOAL #2   Title Prehension strength improve with 3-5 lbs to turn knob, car key , cut food    Baseline see flowsheet    Time 6   Period Weeks   Status New   OT LONG TERM GOAL #3   Title Function score on PRWHE improve with 20 points    Baseline at eval score for function 30.5/50   Time 5   Period Weeks   Status New               Plan - 06/05/16 1906    Clinical Impression Statement Pt present more than 3 months out from COlles fracture of distal radius - pt show decrease ROM in wrist in all planes and  also end range for fisting /thumb - pt report  numbness over thumb thru 3rd digits that got worse the last 3 wks - was always just  some pins and needles -  pt show increase edema at wist and  tightness in soft tissue    Rehab Potential Good   Clinical Impairments Affecting Rehab Potential CTS more than 3 wks - last 3 wks worse   OT Frequency 2x / week   OT Duration 6 weeks   OT Treatment/Interventions Self-care/ADL training;Contrast Bath;Fluidtherapy;Ultrasound;Therapeutic exercise;Patient/family education;Splinting;Manual Therapy;Passive range of motion   Plan assess progress with HEP  OT Home Exercise Plan see pt instruction       Patient will benefit from skilled therapeutic intervention in order to improve the following deficits and impairments:  Impaired flexibility, Decreased range of motion, Decreased coordination, Impaired sensation, Increased edema, Decreased knowledge of precautions, Pain, Impaired UE functional use, Decreased strength  Visit Diagnosis: Stiffness of right wrist, not elsewhere  classified - Plan: Ot plan of care cert/re-cert  Stiffness of right hand, not elsewhere classified - Plan: Ot plan of care cert/re-cert  Carpal tunnel syndrome, right upper limb - Plan: Ot plan of care cert/re-cert  Muscle weakness (generalized) - Plan: Ot plan of care cert/re-cert  Pain in right wrist - Plan: Ot plan of care cert/re-cert    Problem List Patient Active Problem List   Diagnosis Date Noted  . DM (diabetes mellitus), type 1 ([place]) 05/31/2015  . Hyperlipidemia 05/31/2015  . Celiac disease 05/31/2015    Rosalyn Gess OTR/L,CLT  06/05/2016, 7:19 PM  [facility] [address] [place], [address] Phone: [phone]   Fax:  [phone]  Name: Dustin Ray MRN: [id number] Date of Birth: 15-Mar-1971

## 2016-06-05 NOTE — Patient Instructions (Signed)
Contrast   Tendon glides   Opposition - sliding down 5th  PROM composite flexion of digits if needed   PROM for wrist exte, flexion ,RD and UD , supnation  Pronation  Followed by 16 hammer holding 1/2 way for sup/pro, RD and UD  And place and hold for wrist flexion and extention  10 reps each  Ed on CTS - and modifications of tasks

## 2016-06-10 ENCOUNTER — Ambulatory Visit: Payer: 59 | Admitting: Occupational Therapy

## 2016-06-10 DIAGNOSIS — M25631 Stiffness of right wrist, not elsewhere classified: Secondary | ICD-10-CM

## 2016-06-10 DIAGNOSIS — G5601 Carpal tunnel syndrome, right upper limb: Secondary | ICD-10-CM

## 2016-06-10 DIAGNOSIS — M6281 Muscle weakness (generalized): Secondary | ICD-10-CM

## 2016-06-10 DIAGNOSIS — M25531 Pain in right wrist: Secondary | ICD-10-CM

## 2016-06-10 DIAGNOSIS — M25641 Stiffness of right hand, not elsewhere classified: Secondary | ICD-10-CM

## 2016-06-10 NOTE — Therapy (Signed)
[place] [facility] [address] [place], [address] Phone: [phone]   Fax:  [phone]  Occupational Therapy Treatment  Patient Details  Name: Dustin Ray MRN: [id number] Date of Birth: 1971-01-12 Referring Provider: Sabra Heck   Encounter Date: 06/10/2016      OT End of Session - 06/10/16 0856    Visit Number 2   Number of Visits 12   Date for OT Re-Evaluation 07/17/16   OT Start Time 0815   OT Stop Time 0917   OT Time Calculation (min) 62 min   Activity Tolerance Patient tolerated treatment well   Behavior During Therapy [facility] for tasks assessed/performed      Past Medical History  Diagnosis Date  . Hyperlipidemia   . Celiac disease   . Diabetes mellitus without complication [facility])     Past Surgical History  Procedure Laterality Date  . Cervical disc surgery      There were no vitals filed for this visit.      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 No/denies            [facility] OT Assessment - 06/10/16 0001    AROM   Right Forearm Pronation 70 Degrees   Right Forearm Supination 70 Degrees   Right Wrist Extension 46 Degrees   Right Wrist Flexion 30 Degrees   Right Wrist Radial Deviation 10 Degrees   Right Wrist Ulnar Deviation 20 Degrees                  OT Treatments/Exercises (OP) - 06/10/16 0001    RUE Fluidotherapy   Number Minutes Fluidotherapy 10 Minutes   RUE Fluidotherapy Location Hand;Wrist   Comments AROM at [facility] to increase ROM at wrist and digits - decrease pain       Measurements taken  See flowsheet  Fluido done with AROM   Graston tools nr 4 and 2 for sweeping,  scooping and brushing over volar wrist , CT and palm - to decrease tightness , CT symptoms , pain   Tendon glides  Opposition - sliding down 5th    PROM for wrist extenion, flexion ,RD and UD over edge of table PROM by OT for  supnation and pronation Hammer 16oz for wrist sup/pro - 10 reps - v/c for keeping elbow to side - pt was compensating with int/ext rotation of shoulder  Hammer for RD and UD reviewed - pt to keep ar over armrest and block elbow from moving    BTE for CPM wrist flexion and extention 200 sec  Wrist extention 1 lbs on BTE place and hold -tool 701  RD and UD - done large knob  120 sec for 4 lbs  Ice at end    10 reps each  Ed on CTS - and modifications of tasks           OT Education - 06/10/16 0856    Education provided Yes   Education Details HEP update   Person(s) Educated Patient   Methods Explanation;Demonstration;Tactile cues;Verbal cues   Comprehension Verbal cues required;Returned demonstration;Verbalized understanding  OT Short Term Goals - 06/05/16 1909    OT SHORT TERM GOAL #1   Title R wrist AROM inmprove in all planes 10 -20 degrees to turn doorknob , push up from chair    Baseline see flowsheet    Time 4   Period Weeks   Status New   OT SHORT TERM GOAL #2   Title Pt report decrease in numbness and CTS in R hand doing HEP and modifications    Baseline numbness the last 3wks - before that was only tingling    Time 3   Period Weeks   Status New   OT SHORT TERM GOAL #3   Title Pt report increase ease with fisting and use of thumb during ADL's and IADL's    Baseline see flowsheet   Time 3   Period Weeks   Status New   OT SHORT TERM GOAL #4   Title Pain on PRWHE improve with at least 20 points    Baseline pain at eval 32/50   Time 3   Period Weeks   Status New           OT Long Term Goals - 06/05/16 1913    OT LONG TERM GOAL #1   Title Grip strenght in R hand improve to  50 % compare to L to hold and  carry 8 lbs or more    Baseline Grip 29 R , L 105 lbs    Time 6   Period Weeks   Status New   OT LONG TERM GOAL #2   Title Prehension strength improve with 3-5 lbs to turn knob, car key , cut food    Baseline see flowsheet    Time 6   Period Weeks   Status New   OT LONG TERM GOAL #3   Title Function score on PRWHE improve with 20 points    Baseline at eval score for function 30.5/50   Time 5   Period Weeks   Status New               Plan - 06/10/16 0857    Clinical Impression Statement Pt made some god progress in ROM - RD the lease and flexion but did not encourage that because of CT symptoms - but pt report numbness more in tips now - pt cont to have sweling over volar wrist - decrease ROM -   Rehab Potential Good   Clinical Impairments Affecting Rehab Potential CTS more than 3 wks - last 3 wks worse   OT Frequency 2x / week   OT Duration 6 weeks   OT Treatment/Interventions Self-care/ADL training;Contrast Bath;Fluidtherapy;Ultrasound;Therapeutic exercise;Patient/family education;Splinting;Manual Therapy;Passive range of motion   Plan assess progress and numbness   OT Home Exercise Plan see pt instruction    Consulted and Agree with Plan of Care Patient      Patient will benefit from skilled therapeutic intervention in order to improve the following deficits and impairments:     Visit Diagnosis: Stiffness of right wrist, not elsewhere classified  Stiffness of right hand, not elsewhere classified  Carpal tunnel syndrome, right upper limb  Muscle weakness (generalized)  Pain in right wrist    Problem List Patient Active Problem List   Diagnosis Date Noted  . DM (diabetes mellitus), type 1 ([place]) 05/31/2015  . Hyperlipidemia 05/31/2015  . Celiac disease 05/31/2015    Rosalyn Gess OTR/L,CLT  06/10/2016, 9:27 AM  [place] [facility] [address] [place], [address] Phone: [phone]    Fax:  [phone]  Name: Dustin Ray MRN: [id number] Date of Birth: 24-Feb-1971

## 2016-06-10 NOTE — Patient Instructions (Addendum)
Same HEP - do not push wrist flexion with CT symptoms

## 2016-06-13 ENCOUNTER — Ambulatory Visit: Payer: 59 | Admitting: Occupational Therapy

## 2016-06-13 DIAGNOSIS — M25631 Stiffness of right wrist, not elsewhere classified: Secondary | ICD-10-CM

## 2016-06-13 DIAGNOSIS — M25531 Pain in right wrist: Secondary | ICD-10-CM

## 2016-06-13 DIAGNOSIS — M6281 Muscle weakness (generalized): Secondary | ICD-10-CM

## 2016-06-13 DIAGNOSIS — G5601 Carpal tunnel syndrome, right upper limb: Secondary | ICD-10-CM

## 2016-06-13 DIAGNOSIS — M25641 Stiffness of right hand, not elsewhere classified: Secondary | ICD-10-CM

## 2016-06-13 NOTE — Therapy (Signed)
[place] [facility] [address] [place], [address] Phone: [phone]   Fax:  [phone]  Occupational Therapy Treatment  Patient Details  Name: Dustin Ray MRN: [id number] Date of Birth: 1971-03-25 Referring Provider: Sabra Heck   Encounter Date: 06/13/2016      OT End of Session - 06/13/16 0936    Visit Number 3   Number of Visits 12   Date for OT Re-Evaluation 07/17/16   OT Start Time 0925   OT Stop Time 1033   OT Time Calculation (min) 68 min   Activity Tolerance Patient tolerated treatment well   Behavior During Therapy [facility] for tasks assessed/performed      Past Medical History  Diagnosis Date  . Hyperlipidemia   . Celiac disease   . Diabetes mellitus without complication [facility])     Past Surgical History  Procedure Laterality Date  . Cervical disc surgery      There were no vitals filed for this visit.      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 No/denies            [facility] OT Assessment - 06/13/16 0001    AROM   Right Forearm Pronation 70 Degrees   Right Forearm Supination 70 Degrees   Right Wrist Extension 45 Degrees   Right Wrist Flexion 34 Degrees   Right Wrist Radial Deviation 13 Degrees   Right Wrist Ulnar Deviation 23 Degrees                  OT Treatments/Exercises (OP) - 06/13/16 0001    RUE Fluidotherapy   Number Minutes Fluidotherapy 10 Minutes   RUE Fluidotherapy Location Hand;Wrist   Comments AROM for wrist and digits at [facility] to increase ROM       Measurements taken  See flowsheet Fluido done with AROM at wrist and decrease pain   Graston tools nr 4 and 2 for  sweeping, scooping and brushing over volar wrist , CT and palm - to decrease tightness , CT symptoms , pain    BTE for sup/pro 200 sec each on CPM   2lbs each for end range 120 sec sup and pronation      BTE for CPM wrist flexion 200 sec    and extention 200 sec  Wrist extention 1 lbs on BTE place and hold- compensate with UD - 100 sec  Flexion done 12 lbs but open hand with tool 701 not to increase CT symptoms  120 sec    RD and UD - done 2 lbs  With arm to side  SUp/Pron 2lbs elbow to sdie  Mod V/c to not compensate with  Wrist flexion into ulnar deviatoin -     15 reps x 2                     OT Education - 06/13/16 0935    Education provided Yes   Education Details HEP update   Person(s) Educated Patient   Methods Explanation;Demonstration;Verbal cues   Comprehension Verbal cues required;Returned demonstration;Verbalized understanding  OT Short Term Goals - 06/05/16 1909    OT SHORT TERM GOAL #1   Title R wrist AROM inmprove in all planes 10 -20 degrees to turn doorknob , push up from chair    Baseline see flowsheet    Time 4   Period Weeks   Status New   OT SHORT TERM GOAL #2   Title Pt report decrease in numbness and CTS in R hand doing HEP and modifications    Baseline numbness the last 3wks - before that was only tingling    Time 3   Period Weeks   Status New   OT SHORT TERM GOAL #3   Title Pt report increase ease with fisting and use of thumb during ADL's and IADL's    Baseline see flowsheet   Time 3   Period Weeks   Status New   OT SHORT TERM GOAL #4   Title Pain on PRWHE improve with at least 20 points    Baseline pain at eval 32/50   Time 3   Period Weeks   Status New           OT Long Term Goals - 06/05/16 1913    OT LONG TERM GOAL #1   Title Grip strenght in R hand improve to  50 % compare to L to hold and carry 8 lbs or more    Baseline Grip 29 R , L 105 lbs    Time 6   Period Weeks   Status New   OT  LONG TERM GOAL #2   Title Prehension strength improve with 3-5 lbs to turn knob, car key , cut food    Baseline see flowsheet    Time 6   Period Weeks   Status New   OT LONG TERM GOAL #3   Title Function score on PRWHE improve with 20 points    Baseline at eval score for function 30.5/50   Time 5   Period Weeks   Status New               Plan - 06/13/16 7026    Clinical Impression Statement Pt cont to have increase swelling over wrist- volar more - and compensate with wrist flexion and ulnar deviation with all wrist motion - pt cont to have CT symptoms    Rehab Potential Good   Clinical Impairments Affecting Rehab Potential CTS more than 3 wks - last 3 wks worse   OT Frequency 2x / week   OT Duration 6 weeks   OT Treatment/Interventions Self-care/ADL training;Contrast Bath;Fluidtherapy;Ultrasound;Therapeutic exercise;Patient/family education;Splinting;Manual Therapy;Passive range of motion   OT Home Exercise Plan see pt instruction    Consulted and Agree with Plan of Care Patient      Patient will benefit from skilled therapeutic intervention in order to improve the following deficits and impairments:  Impaired flexibility, Decreased range of motion, Decreased coordination, Impaired sensation, Increased edema, Decreased knowledge of precautions, Pain, Impaired UE functional use, Decreased strength  Visit Diagnosis: Stiffness of right wrist, not elsewhere classified  Stiffness of right hand, not elsewhere classified  Carpal tunnel syndrome, right upper limb  Muscle weakness (generalized)  Pain in right wrist    Problem List Patient Active Problem List   Diagnosis Date Noted  . DM (diabetes mellitus), type 1 ([place]) 05/31/2015  . Hyperlipidemia 05/31/2015  . Celiac disease 05/31/2015    Rosalyn Gess OTR/L,CLT  06/13/2016, 11:35 AM  [place] [facility] [address], [place], [address] Phone:  [phone]   Fax:  [phone]  Name: Dustin Ray MRN: [id number] Date of Birth: 1971-07-29

## 2016-06-13 NOTE — Patient Instructions (Addendum)
Pt to focus on pronation and supunation PROM  And then 2 lbs weight for that  And RD and UD to side - but make sure with all that wrist neutral and not into flexion

## 2016-06-16 ENCOUNTER — Ambulatory Visit: Payer: 59 | Admitting: Occupational Therapy

## 2016-06-16 DIAGNOSIS — M6281 Muscle weakness (generalized): Secondary | ICD-10-CM

## 2016-06-16 DIAGNOSIS — M25531 Pain in right wrist: Secondary | ICD-10-CM

## 2016-06-16 DIAGNOSIS — G5601 Carpal tunnel syndrome, right upper limb: Secondary | ICD-10-CM

## 2016-06-16 DIAGNOSIS — M25641 Stiffness of right hand, not elsewhere classified: Secondary | ICD-10-CM

## 2016-06-16 DIAGNOSIS — M25631 Stiffness of right wrist, not elsewhere classified: Secondary | ICD-10-CM | POA: Diagnosis not present

## 2016-06-16 NOTE — Therapy (Signed)
[place] [facility] [address] [place], [address] Phone: [phone]   Fax:  [phone]  Occupational Therapy Treatment  Patient Details  Name: Dustin Ray MRN: [id number] Date of Birth: 1971/03/10 Referring Provider: Sabra Heck   Encounter Date: 06/16/2016      OT End of Session - 06/16/16 1815    Visit Number 4   Number of Visits 12   Date for OT Re-Evaluation 07/17/16   OT Start Time 1505   OT Stop Time 1557   OT Time Calculation (min) 52 min   Activity Tolerance Patient tolerated treatment well   Behavior During Therapy [facility] for tasks assessed/performed      Past Medical History:  Diagnosis Date  . Celiac disease   . Diabetes mellitus without complication ([place])   . Hyperlipidemia     Past Surgical History:  Procedure Laterality Date  . CERVICAL DISC SURGERY      There were no vitals filed for this visit.      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 No/denies            [facility] OT Assessment - 06/16/16 0001      AROM   Right Wrist Extension 50 Degrees   Right Wrist Flexion 30 Degrees                  OT Treatments/Exercises (OP) - 06/16/16 0001      RUE Fluidotherapy   Number Minutes Fluidotherapy 10 Minutes   RUE Fluidotherapy Location Hand;Wrist   Comments AROM for wrist and digits in all planes at [facility] to icnrease ROM       Measurements for wrist and grip - only extention improve See flowsheet Fluido done with AROM at wrist and decrease pain   Graston tools nr 4 and 2 for sweeping, scooping and brushing over volar wrist , CT and palm - to decrease tightness ,  CT symptoms , pain    PROM for wrist sup /pro 10 reps each     BTE for CPM wrist flexion 200 sec    and extention 200 sec  Wrist extention 1 lbs on BTE place and hold-  - 120 sec - focus to keep midline Flexion done 12 lbs but open hand with tool 701 not to increase CT symptoms  120 sec    RD and UD - done 4 lbs  With arm to side  - large knob 120 sec each  Mod V/c to not compensate with  Wrist flexion into ulnar deviatoin -     Can do rolling over ball to simulate CT spread            OT Education - 06/16/16 1814    Education provided Yes   Education Details HEP updated   Person(s) Educated Patient   Methods Explanation;Demonstration;Tactile cues;Verbal cues   Comprehension Verbal cues required;Returned demonstration;Verbalized understanding          OT Short Term Goals - 06/05/16 1909      OT SHORT TERM GOAL #1   Title  R wrist AROM inmprove in all planes 10 -20 degrees to turn doorknob , push up from chair    Baseline see flowsheet    Time 4   Period Weeks   Status New     OT SHORT TERM GOAL #2   Title Pt report decrease in numbness and CTS in R hand doing HEP and modifications    Baseline numbness the last 3wks - before that was only tingling    Time 3   Period Weeks   Status New     OT SHORT TERM GOAL #3   Title Pt report increase ease with fisting and use of thumb during ADL's and IADL's    Baseline see flowsheet   Time 3   Period Weeks   Status New     OT SHORT TERM GOAL #4   Title Pain on PRWHE improve with at least 20 points    Baseline pain at eval 32/50   Time 3   Period Weeks   Status New           OT Long Term Goals - 06/05/16 1913      OT LONG TERM GOAL #1   Title Grip strenght in R hand improve to  50 % compare to L to hold and carry 8 lbs or more    Baseline Grip 29 R , L 105 lbs    Time 6   Period Weeks   Status New     OT LONG TERM GOAL #2   Title Prehension strength improve with 3-5 lbs to turn knob, car key ,  cut food    Baseline see flowsheet    Time 6   Period Weeks   Status New     OT LONG TERM GOAL #3   Title Function score on PRWHE improve with 20 points    Baseline at eval score for function 30.5/50   Time 5   Period Weeks   Status New               Plan - 06/16/16 1815    Clinical Impression Statement Pt CT and volar wrist do not look as tight and swollen as last week - and report decrease numbness in proximal phalanges at 3rd - stil avoiding to much grip and wrist flexion - and keep pull or pain decreased on ulnar side of wrist during ther ex    Rehab Potential Good   Clinical Impairments Affecting Rehab Potential CTS more than 3 wks - last 3 wks worse   OT Frequency 2x / week   OT Duration 6 weeks   OT Treatment/Interventions Self-care/ADL training;Contrast Bath;Fluidtherapy;Ultrasound;Therapeutic exercise;Patient/family education;Splinting;Manual Therapy;Passive range of motion   Plan cont to monitor CT symptoms during progress   OT Home Exercise Plan see pt instruction    Consulted and Agree with Plan of Care Patient      Patient will benefit from skilled therapeutic intervention in order to improve the following deficits and impairments:  Impaired flexibility, Decreased range of motion, Decreased coordination, Impaired sensation, Increased edema, Decreased knowledge of precautions, Pain, Impaired UE functional use, Decreased strength  Visit Diagnosis: Stiffness of right wrist, not elsewhere classified  Stiffness of right hand, not elsewhere classified  Carpal tunnel syndrome, right upper limb  Muscle weakness (generalized)  Pain in right wrist    Problem List Patient Active Problem List   Diagnosis Date Noted  . DM (diabetes mellitus), type 1 ([place]) 05/31/2015  . Hyperlipidemia 05/31/2015  . Celiac disease  05/31/2015    Rosalyn Gess OTR/L,CLT  06/16/2016, 6:18 PM  [place] [facility] [address] [place], [address] Phone: [phone]   Fax:  [phone]  Name: Dustin Ray MRN: [id number] Date of Birth: [birth year]

## 2016-06-16 NOTE — Patient Instructions (Signed)
Can use 2 lbs for wrist extention stretch - and then strenthening for wrist extention  10 reps

## 2016-06-19 ENCOUNTER — Ambulatory Visit: Payer: 59 | Admitting: Occupational Therapy

## 2016-06-19 DIAGNOSIS — M25631 Stiffness of right wrist, not elsewhere classified: Secondary | ICD-10-CM | POA: Diagnosis not present

## 2016-06-19 DIAGNOSIS — G5601 Carpal tunnel syndrome, right upper limb: Secondary | ICD-10-CM

## 2016-06-19 DIAGNOSIS — M6281 Muscle weakness (generalized): Secondary | ICD-10-CM

## 2016-06-19 DIAGNOSIS — M25531 Pain in right wrist: Secondary | ICD-10-CM

## 2016-06-19 DIAGNOSIS — M25641 Stiffness of right hand, not elsewhere classified: Secondary | ICD-10-CM

## 2016-06-19 NOTE — Therapy (Signed)
[place] [facility] [address] [place], [address] Phone: [phone]   Fax:  [phone]  Occupational Therapy Treatment  Patient Details  Name: Dustin Ray MRN: [id number] Date of Birth: 09-22-1971 Referring Provider: Sabra Heck   Encounter Date: 06/19/2016      OT End of Session - 06/19/16 1848    Visit Number 5   Number of Visits 12   Date for OT Re-Evaluation 07/17/16   OT Start Time 1800   OT Stop Time 1905   OT Time Calculation (min) 65 min   Activity Tolerance Patient tolerated treatment well   Behavior During Therapy [facility] for tasks assessed/performed      Past Medical History:  Diagnosis Date  . Celiac disease   . Diabetes mellitus without complication ([place])   . Hyperlipidemia     Past Surgical History:  Procedure Laterality Date  . CERVICAL DISC SURGERY      There were no vitals filed for this visit.      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 No/denies            [facility] OT Assessment - 06/19/16 0001      AROM   Right Forearm Pronation 75 Degrees   Right Forearm Supination 75 Degrees                  OT Treatments/Exercises (OP) - 06/19/16 0001      RUE Fluidotherapy   Number Minutes Fluidotherapy 10 Minutes   RUE Fluidotherapy Location Hand;Wrist   Comments at soc to increase ROM  and decrease pain at wrist       Measurements for wrist Fluido done with AROM at wrist and decrease pain      PROM for wrist sup and place and hold with fist Then 2 lbs for neutral to supination  10 reps all   1 kg ball - toss from palm to palm - into  supination R hand 1 min     BTE for CPM wrist flexion 200 sec  and extention 200 sec  Wrist extention 1 lbs on BTE  - 120 sec - focus to keep midline Flexion done15 lbs but open hand with tool 701 not to increase CT symptoms  120 sec  Wrist extention table slides done with roll up towel 20 reps  And rolling 1 kg ball on wall at shoulder height with wrist in extention  1 min    Teal putty for grip , lat and 3 point grip  But not increase CT symptoms - 12-15 reps  Done in clinic  Mod A and v/c to keep wrist in neutral or some extnetion - collapse into flexion            OT Education - 06/19/16 1847    Education provided Yes   Education Details HEP review   Person(s) Educated Patient   Methods Explanation;Demonstration;Tactile cues;Verbal cues   Comprehension Verbalized understanding;Returned demonstration;Verbal cues required          OT Short Term  Goals - 06/05/16 1909      OT SHORT TERM GOAL #1   Title R wrist AROM inmprove in all planes 10 -20 degrees to turn doorknob , push up from chair    Baseline see flowsheet    Time 4   Period Weeks   Status New     OT SHORT TERM GOAL #2   Title Pt report decrease in numbness and CTS in R hand doing HEP and modifications    Baseline numbness the last 3wks - before that was only tingling    Time 3   Period Weeks   Status New     OT SHORT TERM GOAL #3   Title Pt report increase ease with fisting and use of thumb during ADL's and IADL's    Baseline see flowsheet   Time 3   Period Weeks   Status New     OT SHORT TERM GOAL #4   Title Pain on PRWHE improve with at least 20 points    Baseline pain at eval 32/50   Time 3   Period Weeks   Status New           OT Long Term Goals - 06/05/16 1913      OT LONG TERM GOAL #1   Title Grip strenght in R hand improve to  50 % compare to L to hold and carry 8 lbs or more    Baseline Grip 29 R , L 105 lbs    Time 6   Period Weeks   Status New     OT LONG TERM  GOAL #2   Title Prehension strength improve with 3-5 lbs to turn knob, car key , cut food    Baseline see flowsheet    Time 6   Period Weeks   Status New     OT LONG TERM GOAL #3   Title Function score on PRWHE improve with 20 points    Baseline at eval score for function 30.5/50   Time 5   Period Weeks   Status New               Plan - 06/19/16 1849    Clinical Impression Statement Pt cont to show slow but steady progress in ROM and use - CT symptoms slowly improving but still present - did as some putty this date - but pt to start slowly    Rehab Potential Good   Clinical Impairments Affecting Rehab Potential CTS more than 3 wks - last 3 wks worse   OT Frequency 2x / week   OT Duration 6 weeks   OT Treatment/Interventions Self-care/ADL training;Contrast Bath;Fluidtherapy;Ultrasound;Therapeutic exercise;Patient/family education;Splinting;Manual Therapy;Passive range of motion   Plan Assess if CT symptoms still okay - with adding putty    OT Home Exercise Plan see pt instruction    Consulted and Agree with Plan of Care Patient      Patient will benefit from skilled therapeutic intervention in order to improve the following deficits and impairments:  Impaired flexibility, Decreased range of motion, Decreased coordination, Impaired sensation, Increased edema, Decreased knowledge of precautions, Pain, Impaired UE functional use, Decreased strength  Visit Diagnosis: Stiffness of right wrist, not elsewhere classified  Stiffness of right hand, not elsewhere classified  Carpal tunnel syndrome, right upper limb  Muscle weakness (generalized)  Pain in right wrist    Problem List Patient Active Problem List   Diagnosis Date Noted  . DM (diabetes mellitus), type 1 ([place]) 05/31/2015  . Hyperlipidemia  05/31/2015  . Celiac disease 05/31/2015    Rosalyn Gess OTR/L,CLT  06/19/2016, 7:27 PM  [place] [facility] [address] [place], [address] Phone: [phone]   Fax:  [phone]  Name: ORLANDA LEMMERMAN MRN: [id number] Date of Birth: 1970-12-24

## 2016-06-19 NOTE — Patient Instructions (Signed)
Add table slides for wrist extention  And rolling ball on wall for wrist extention  10-15 reps pain less than 2/10   And then teal putty for gripping , lat and 3 point grip added - only 12 reps  2-3 x day  Not increase CT symptoms

## 2016-06-24 ENCOUNTER — Ambulatory Visit: Payer: 59 | Attending: Specialist | Admitting: Occupational Therapy

## 2016-06-24 DIAGNOSIS — M25641 Stiffness of right hand, not elsewhere classified: Secondary | ICD-10-CM

## 2016-06-24 DIAGNOSIS — M25631 Stiffness of right wrist, not elsewhere classified: Secondary | ICD-10-CM | POA: Diagnosis not present

## 2016-06-24 DIAGNOSIS — M25531 Pain in right wrist: Secondary | ICD-10-CM | POA: Diagnosis present

## 2016-06-24 DIAGNOSIS — G5601 Carpal tunnel syndrome, right upper limb: Secondary | ICD-10-CM | POA: Insufficient documentation

## 2016-06-24 DIAGNOSIS — M6281 Muscle weakness (generalized): Secondary | ICD-10-CM | POA: Diagnosis present

## 2016-06-24 NOTE — Patient Instructions (Signed)
Pt to hold off on any weight or putty   and do only heat   PROM over edge of table for wrist extention, flexion , RD and UD  10 reps - And PROM sup/pro end range  2 x day  Ice at end as needed

## 2016-06-24 NOTE — Therapy (Signed)
[facility] [address] [place], [address] Phone: [phone]   Fax:  [phone]  Occupational Therapy Treatment  Patient Details  Name: Dustin Ray MRN: [id number] Date of Birth: 1971-01-12 Referring Provider: Sabra Heck   Encounter Date: 06/24/2016      OT End of Session - 06/24/16 1758    Visit Number 6   Number of Visits 12   Date for OT Re-Evaluation 07/17/16   OT Start Time 1216   OT Stop Time 1301   OT Time Calculation (min) 45 min   Activity Tolerance Patient tolerated treatment well   Behavior During Therapy [facility] for tasks assessed/performed      Past Medical History:  Diagnosis Date  . Celiac disease   . Diabetes mellitus without complication ([place])   . Hyperlipidemia     Past Surgical History:  Procedure Laterality Date  . CERVICAL DISC SURGERY      There were no vitals filed for this visit.      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 -    Pain Score 5    Pain Location Wrist   Pain Orientation Right   Pain Descriptors / Indicators Aching   Pain Onset More than a month ago            [facility] OT Assessment - 06/24/16 0001      AROM   Right Forearm Pronation 75 Degrees   Right Forearm Supination 75 Degrees   Right Wrist Extension 52 Degrees   Right Wrist Flexion 33 Degrees     Strength   Right Hand Grip (lbs) 29   Right Hand Lateral Pinch 14 lbs   Right Hand 3 Point Pinch 12 lbs   Left Hand Grip (lbs) 104   Left Hand Lateral Pinch 22 lbs       Measurements for wrist and grip  See flowsheet Fluido done  with AROM at wrist  To decrease  pain   pain this date at [facility] on ulnar side of wrist - ulnar styloid   Graston tools nr 4 and 2 for sweeping, scooping and brushing over volar wrist , CT and palm - to decrease tightness , CT symptoms , pain  after fluido             OT Treatments/Exercises (OP) - 06/24/16 0001      RUE Fluidotherapy   Number Minutes Fluidotherapy 10 Minutes   RUE Fluidotherapy Location Hand;Wrist   Comments SOC to decrease pain to 1/10 prior to ther ex - pain at [facility] was 5/10      Pt to hold off on any weight or putty   and do only heat  At home   in clinic  PROM over edge of table for wrist extention, flexion , RD and UD  10 reps - And PROM sup/pro end range  2 x day  No increase pain  Ice at end as needed  OT Education - 06/24/16 1758    Education provided Yes   Education Details change HEP    Person(s) Educated Patient   Methods Explanation;Demonstration;Tactile cues;Verbal cues   Comprehension Verbal cues required;Returned demonstration;Verbalized understanding          OT Short Term Goals - 06/05/16 1909      OT SHORT TERM GOAL #1   Title R wrist AROM inmprove in all planes 10 -20 degrees to turn doorknob , push up from chair    Baseline see flowsheet    Time 4   Period Weeks   Status New     OT SHORT TERM GOAL #2   Title Pt report decrease in numbness and CTS in R hand doing HEP and modifications    Baseline numbness the last 3wks - before that was only tingling    Time 3   Period Weeks   Status New     OT SHORT TERM GOAL #3   Title Pt report increase ease with fisting and use of thumb during ADL's and IADL's    Baseline see flowsheet   Time 3   Period Weeks   Status New     OT SHORT TERM GOAL #4   Title Pain on PRWHE improve with at least 20 points    Baseline pain at eval 32/50   Time 3   Period Weeks   Status New           OT Long Term Goals - 06/05/16 1913      OT LONG TERM GOAL #1   Title  Grip strenght in R hand improve to  50 % compare to L to hold and carry 8 lbs or more    Baseline Grip 29 R , L 105 lbs    Time 6   Period Weeks   Status New     OT LONG TERM GOAL #2   Title Prehension strength improve with 3-5 lbs to turn knob, car key , cut food    Baseline see flowsheet    Time 6   Period Weeks   Status New     OT LONG TERM GOAL #3   Title Function score on PRWHE improve with 20 points    Baseline at eval score for function 30.5/50   Time 5   Period Weeks   Status New               Plan - 06/24/16 1759    Clinical Impression Statement Pt this date with increase pain on Ulnar side of wrist - ? over use of ECU - some pain with wrist ext, UD  and end range sup/pro - pt maybe over did HEP with wrist extention slides on wall and table  - and wrist extention on ball on wall - pt to hold off on any  resistance  and only do PROM over edge of table and pain about 1/10    Rehab Potential Good   Clinical Impairments Affecting Rehab Potential CTS more than 3 wks - last 3 wks worse   OT Frequency 2x / week   OT Duration 4 weeks   OT Treatment/Interventions Self-care/ADL training;Contrast Bath;Fluidtherapy;Ultrasound;Therapeutic exercise;Patient/family education;Splinting;Manual Therapy;Passive range of motion   Plan assess if pain decrease    OT Home Exercise Plan see pt instruction    Consulted and Agree with Plan of Care Patient      Patient will benefit from skilled therapeutic intervention in order to improve the following deficits and impairments:  Impaired flexibility, Decreased range of motion, Decreased coordination, Impaired sensation, Increased edema, Decreased knowledge of precautions, Pain, Impaired UE functional use, Decreased strength  Visit Diagnosis: Stiffness of right wrist, not elsewhere classified  Stiffness of right hand, not elsewhere classified  Carpal tunnel syndrome, right upper limb  Muscle weakness (generalized)  Pain in right  wrist    Problem List Patient Active Problem List   Diagnosis Date Noted  . DM (diabetes mellitus), type 1 ([place]) 05/31/2015  . Hyperlipidemia 05/31/2015  . Celiac disease 05/31/2015    Rosalyn Gess  OTR/L,CLT  06/24/2016, 6:02 PM  [facility] [address], [place], [address] Phone: [phone]   Fax:  [phone]  Name: Dustin Ray MRN: [id number] Date of Birth: September 14, 1971

## 2016-06-26 ENCOUNTER — Ambulatory Visit: Payer: 59 | Admitting: Occupational Therapy

## 2016-06-26 ENCOUNTER — Ambulatory Visit: Payer: 59 | Admitting: Family Medicine

## 2016-06-26 DIAGNOSIS — M25631 Stiffness of right wrist, not elsewhere classified: Secondary | ICD-10-CM | POA: Diagnosis not present

## 2016-06-26 DIAGNOSIS — M6281 Muscle weakness (generalized): Secondary | ICD-10-CM

## 2016-06-26 DIAGNOSIS — M25531 Pain in right wrist: Secondary | ICD-10-CM

## 2016-06-26 DIAGNOSIS — G5601 Carpal tunnel syndrome, right upper limb: Secondary | ICD-10-CM

## 2016-06-26 DIAGNOSIS — M25641 Stiffness of right hand, not elsewhere classified: Secondary | ICD-10-CM

## 2016-06-26 NOTE — Therapy (Signed)
[facility] [address], [place], [address] Phone: [phone]   Fax:  [phone]  Occupational Therapy Treatment  Patient Details  Name: Dustin Ray MRN: [id number] Date of Birth: 06/16/1971 Referring Provider: Sabra Heck   Encounter Date: 06/26/2016      OT End of Session - 06/26/16 1016    Visit Number 7   Number of Visits 12   Date for OT Re-Evaluation 07/17/16   OT Start Time 0840   OT Stop Time 0920   OT Time Calculation (min) 40 min   Activity Tolerance Patient tolerated treatment well   Behavior During Therapy [facility] for tasks assessed/performed      Past Medical History:  Diagnosis Date  . Celiac disease   . Diabetes mellitus without complication ([place])   . Hyperlipidemia     Past Surgical History:  Procedure Laterality Date  . CERVICAL DISC SURGERY      There were no vitals filed for this visit.      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 Yes   Pain Score 2    Pain Location Wrist   Pain Orientation Right   Pain Descriptors / Indicators Aching  ulnar side of wrist             [facility] OT Assessment - 06/26/16 0001      AROM   Right Forearm Pronation 75 Degrees   Right Forearm Supination 75 Degrees   Right Wrist Extension 52 Degrees   Right Wrist Flexion 33 Degrees       Measurements for wrist prior and then  At end of session - did increase by 3-5 degrees in flexion and extention  See flowsheet Fluido done with AROM at wrist  To decrease  pain   pain this date at [facility] on ulnar side of wrist - ulnar styloid - but decrease to 2/10 compare to last time   PROM  for wrist flexion, extention on CPM on BTE 200 sec each  Pull on ulnar side of wrist or thru CT  PROM sup/pro by OT   10 reps  Pt to cont with heat and PROM                    OT Education - 06/26/16 1015    Education provided Yes   Education Details HEP   Person(s) Educated Patient   Methods Explanation;Demonstration;Tactile cues;Verbal cues   Comprehension Verbal cues required;Returned demonstration;Verbalized understanding          OT Short Term Goals - 06/26/16 1018      OT SHORT TERM GOAL #1   Title R wrist AROM inmprove in all planes 10 -20 degrees to turn doorknob , push up from chair    Baseline see flowsheet - progressing   Time 4   Period Weeks   Status On-going     OT SHORT TERM GOAL #2   Title Pt report decrease in numbness and CTS in R hand doing HEP and modifications    Baseline  numbness the last 3wks - before that was only tingling - numbness still in tips of thumb thru 3rd - and increase in 3rd with wrist extention    Time 3   Period Weeks   Status On-going     OT SHORT TERM GOAL #3   Title Pt report increase ease with fisting and use of thumb during ADL's and IADL's    Baseline progresing - numbness limiting him   Time 3   Period Weeks   Status On-going     OT SHORT TERM GOAL #4   Title Pain on PRWHE improve with at least 20 points    Baseline pain at eval 32/50 at eval - progressing but increase last time because over did wrist ext at home    Time 3   Period Weeks   Status On-going           OT Long Term Goals - 06/26/16 1020      OT LONG TERM GOAL #1   Title Grip strenght in R hand improve to  50 % compare to L to hold and carry 8 lbs or more    Baseline Grip 29, L 105   Time 4   Period Weeks   Status On-going     OT LONG TERM GOAL #2   Title Prehension strength improve with 3-5 lbs to turn knob, car key , cut food    Baseline see flowsheet    Time 4   Period Weeks   Status On-going     OT LONG TERM GOAL #3    Title Function score on PRWHE improve with 20 points    Baseline at eval score for function 30.5/50 - using it more but still impaired   Time 4   Period Weeks   Status On-going               Plan - 06/26/16 1017    Clinical Impression Statement Pt pain decrease with holdling off on strengthening and AROM - only PROM - ECU still compensating with - pain on ulnar wrist - pt cont to have CTS - and increase numbness in 3rd with PROM of wrist extnetion - ptto see MD this afternoon - will contact me afterwards    Rehab Potential Good   Clinical Impairments Affecting Rehab Potential CTS more than 3 wks - last 3 wks worse prior to starting therapy    OT Frequency 2x / week   OT Duration 4 weeks   OT Treatment/Interventions Self-care/ADL training;Contrast Bath;Fluidtherapy;Ultrasound;Therapeutic exercise;Patient/family education;Splinting;Manual Therapy;Passive range of motion   Plan assess progress and pain - how appt with MD went   OT Home Exercise Plan see pt instruction    Consulted and Agree with Plan of Care Patient      Patient will benefit from skilled therapeutic intervention in order to improve the following deficits and impairments:  Impaired flexibility, Decreased range of motion, Decreased coordination, Impaired sensation, Increased edema, Decreased knowledge of precautions, Pain, Impaired UE functional use, Decreased strength  Visit Diagnosis: Stiffness of right hand, not elsewhere classified  Carpal tunnel syndrome, right upper limb  Muscle weakness (generalized)  Pain in right wrist    Problem List Patient Active Problem List   Diagnosis Date Noted  . DM (diabetes mellitus), type 1 ([place]) 05/31/2015  . Hyperlipidemia 05/31/2015  . Celiac disease 05/31/2015    Rosalyn Gess OTR/L,CLT  06/26/2016, 10:22 AM  [facility] [address]  [place], [place], [address] Phone: [phone]   Fax:   [phone]  Name: CHADD TOLLISON MRN: [id number] Date of Birth: 1971/01/06

## 2016-06-26 NOTE — Patient Instructions (Signed)
Cont with heat and PROM only = hold off on weights or AROM - until next time - appt later today with MD

## 2016-06-27 ENCOUNTER — Other Ambulatory Visit: Payer: Self-pay | Admitting: Family Medicine

## 2016-06-27 DIAGNOSIS — E109 Type 1 diabetes mellitus without complications: Secondary | ICD-10-CM

## 2016-07-03 ENCOUNTER — Encounter: Payer: Self-pay | Admitting: Family Medicine

## 2016-07-03 ENCOUNTER — Ambulatory Visit (INDEPENDENT_AMBULATORY_CARE_PROVIDER_SITE_OTHER): Payer: 59 | Admitting: Family Medicine

## 2016-07-03 ENCOUNTER — Other Ambulatory Visit: Payer: Self-pay

## 2016-07-03 ENCOUNTER — Ambulatory Visit: Payer: 59 | Admitting: Occupational Therapy

## 2016-07-03 VITALS — BP 113/70 | HR 63 | Temp 97.9°F | Ht 71.7 in | Wt 143.0 lb

## 2016-07-03 DIAGNOSIS — E785 Hyperlipidemia, unspecified: Secondary | ICD-10-CM | POA: Diagnosis not present

## 2016-07-03 DIAGNOSIS — M25631 Stiffness of right wrist, not elsewhere classified: Secondary | ICD-10-CM

## 2016-07-03 DIAGNOSIS — G5601 Carpal tunnel syndrome, right upper limb: Secondary | ICD-10-CM

## 2016-07-03 DIAGNOSIS — M25641 Stiffness of right hand, not elsewhere classified: Secondary | ICD-10-CM

## 2016-07-03 DIAGNOSIS — M6281 Muscle weakness (generalized): Secondary | ICD-10-CM

## 2016-07-03 DIAGNOSIS — E10638 Type 1 diabetes mellitus with other oral complications: Secondary | ICD-10-CM

## 2016-07-03 DIAGNOSIS — E109 Type 1 diabetes mellitus without complications: Secondary | ICD-10-CM | POA: Diagnosis not present

## 2016-07-03 DIAGNOSIS — M25531 Pain in right wrist: Secondary | ICD-10-CM

## 2016-07-03 LAB — LP+ALT+AST PICCOLO, WAIVED
ALT (SGPT) PICCOLO, WAIVED: 22 U/L (ref 10–47)
AST (SGOT) Piccolo, Waived: 24 U/L (ref 11–38)
Chol/HDL Ratio Piccolo,Waive: 3.3 mg/dL
Cholesterol Piccolo, Waived: 156 mg/dL (ref <200)
HDL CHOL PICCOLO, WAIVED: 47 mg/dL — AB (ref >59)
LDL Chol Calc Piccolo Waived: 95 mg/dL (ref <100)
TRIGLYCERIDES PICCOLO,WAIVED: 67 mg/dL (ref <150)
VLDL CHOL CALC PICCOLO,WAIVE: 13 mg/dL (ref <30)

## 2016-07-03 LAB — BAYER DCA HB A1C WAIVED: HB A1C (BAYER DCA - WAIVED): 8.4 % — ABNORMAL HIGH (ref <7.0)

## 2016-07-03 LAB — HEMOGLOBIN A1C: Hemoglobin A1C: 8.4

## 2016-07-03 MED ORDER — INSULIN ASPART 100 UNIT/ML FLEXPEN: 20.0000 [IU] | PEN_INJECTOR | Freq: Three times a day (TID) | SUBCUTANEOUS | 12 refills | Status: DC

## 2016-07-03 MED ORDER — LOVASTATIN 40 MG PO TABS: 40.0000 mg | ORAL_TABLET | Freq: Every day | ORAL | 4 refills | Status: DC

## 2016-07-03 MED ORDER — INSULIN GLARGINE 300 UNIT/ML ~~LOC~~ SOPN: 36.0000 [IU] | PEN_INJECTOR | Freq: Every day | SUBCUTANEOUS | 12 refills | Status: DC

## 2016-07-03 MED ORDER — INSULIN PEN NEEDLE 31G X 8 MM MISC: 12 refills | Status: DC

## 2016-07-03 NOTE — Patient Instructions (Addendum)
Same HEP - ROM  Until nerve conduction

## 2016-07-03 NOTE — Assessment & Plan Note (Signed)
Discuss cholesterol still elevated will try lovastatin 40 mg

## 2016-07-03 NOTE — Progress Notes (Signed)
   BP 113/70 (BP Location: Left Arm, Patient Position: Sitting, Cuff Size: Small)   Pulse 63   Temp 97.9 F (36.6 C)   Ht 5' 11.7" (1.821 m)   Wt 143 lb (64.9 kg)   SpO2 96%   BMI 19.56 kg/m    Subjective:    Patient ID: Dustin Ray, male    DOB: 02-Dec-1970, 45 y.o.   MRN: [id number]  HPI: [name] is a 45 y.o. male  Chief Complaint  Patient presents with  . Diabetes  . Hyperlipidemia    Relevant past medical, surgical, family and social history reviewed and updated as indicated. Interim medical history since our last visit reviewed. Allergies and medications reviewed and updated.  Review of Systems  Constitutional: Negative.   Respiratory: Negative.   Cardiovascular: Negative.     Per HPI unless specifically indicated above     Objective:    BP 113/70 (BP Location: Left Arm, Patient Position: Sitting, Cuff Size: Small)   Pulse 63   Temp 97.9 F (36.6 C)   Ht 5' 11.7" (1.821 m)   Wt 143 lb (64.9 kg)   SpO2 96%   BMI 19.56 kg/m   Wt Readings from Last 3 Encounters:  07/03/16 143 lb (64.9 kg)  03/26/16 154 lb (69.9 kg)  03/02/16 156 lb (70.8 kg)    Physical Exam  Constitutional: He is oriented to person, place, and time. He appears well-developed and well-nourished. No distress.  HENT:  Head: Normocephalic and atraumatic.  Right Ear: Hearing normal.  Left Ear: Hearing normal.  Nose: Nose normal.  Eyes: Conjunctivae and lids are normal. Right eye exhibits no discharge. Left eye exhibits no discharge. No scleral icterus.  Cardiovascular: Normal rate, regular rhythm and normal heart sounds.   Pulmonary/Chest: Effort normal and breath sounds normal. No respiratory distress.  Musculoskeletal: Normal range of motion.  Neurological: He is alert and oriented to person, place, and time.  Skin: Skin is intact. No rash noted.  Psychiatric: He has a normal mood and affect. His speech is normal and behavior is normal. Judgment and thought content normal.  Cognition and memory are normal.    Results for orders placed or performed in visit on 03/26/16  Bayer DCA Hb A1c Waived  Result Value Ref Range   Bayer DCA Hb A1c Waived 7.6 (H) <7.0 %      Assessment & Plan:   Problem List Items Addressed This Visit      Endocrine   DM (diabetes mellitus), type 1 ([place]) - Primary   Relevant Medications   lovastatin (MEVACOR) 40 MG tablet   Insulin Glargine (TOUJEO SOLOSTAR) 300 UNIT/ML SOPN   insulin aspart (NOVOLOG FLEXPEN) 100 UNIT/ML FlexPen   Other Relevant Orders   LP+ALT+AST Piccolo, Waived   Bayer DCA Hb A1c Waived   Basic metabolic panel     Other   Hyperlipidemia    Discuss cholesterol still elevated will try lovastatin 40 mg      Relevant Medications   lovastatin (MEVACOR) 40 MG tablet   Other Relevant Orders   LP+ALT+AST Piccolo, Waived   Bayer DCA Hb A1c Waived   Basic metabolic panel    Other Visit Diagnoses   None.      Follow up plan: Return in about 3 months (around 10/03/2016) for BMP,  Lipids, ALT, AST, Hemoglobin A1c.

## 2016-07-03 NOTE — Therapy (Signed)
[facility] [address] [place], [address] Phone: [phone]   Fax:  [phone]  Occupational Therapy Treatment  Patient Details  Name: Dustin Ray MRN: [id number] Date of Birth: August 18, 1971 Referring Provider: Sabra Heck   Encounter Date: 07/03/2016      OT End of Session - 07/03/16 1402    Visit Number 8   Number of Visits 12   Date for OT Re-Evaluation 07/17/16   OT Start Time 1345   OT Stop Time 1433   OT Time Calculation (min) 48 min   Activity Tolerance Patient tolerated treatment well   Behavior During Therapy [facility] for tasks assessed/performed      Past Medical History:  Diagnosis Date  . Celiac disease   . Diabetes mellitus without complication ([place])   . Hyperlipidemia     Past Surgical History:  Procedure Laterality Date  . CERVICAL DISC SURGERY      There were no vitals filed for this visit.      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 Yes   Pain Score 2    Pain Location Wrist   Pain Orientation Right   Pain Descriptors / Indicators Aching            [facility] OT Assessment - 07/03/16 0001      AROM   Right Wrist Extension 56 Degrees   Right Wrist Flexion 28 Degrees     Strength   Right Hand Grip (lbs) 36   Right Hand Lateral Pinch 15 lbs   Right Hand 3 Point Pinch 12 lbs   Left Hand Grip (lbs) 104   Left Hand Lateral Pinch 22 lbs   Left Hand 3 Point Pinch 18 lbs                  OT Treatments/Exercises (OP) - 07/03/16 0001      RUE Paraffin   Number Minutes Paraffin 10 Minutes   RUE Paraffin Location Wrist   Type Other   Comments Wrist flexion stretch at [facility] with heatingpad to increase  flexion        Measurements for wrist ROM and grip /prehension - see flowsheet  Paraffin done     Graston tools - nr 4  And 2 over dorsal wrist sweeping and brushing prior to ROM - pt showed progress to 40 degrees  CMP for wrist flexion 200 sec  Was able to feel pull over dorsal wrist this date Wrist flexion on BTE 701 at 12 lbs - 100 sec   did feel pull on ulnar side  Some pain over volar wrist  Did Graston same tools and techniques over volar wrist , forearm and palm - tightness nad redness - did maintain 40 flexion  PROM over edge of table for wrist extention  BTE CPM for wrist extention and flexion combination  200 sec             OT Education - 07/03/16 1402    Education provided Yes   Education Details HEP   Person(s) Educated Patient   Methods Explanation;Tactile cues;Verbal cues;Demonstration   Comprehension Verbalized understanding;Verbal  cues required;Returned demonstration          OT Short Term Goals - 06/26/16 1018      OT SHORT TERM GOAL #1   Title R wrist AROM inmprove in all planes 10 -20 degrees to turn doorknob , push up from chair    Baseline see flowsheet - progressing   Time 4   Period Weeks   Status On-going     OT SHORT TERM GOAL #2   Title Pt report decrease in numbness and CTS in R hand doing HEP and modifications    Baseline numbness the last 3wks - before that was only tingling - numbness still in tips of thumb thru 3rd - and increase in 3rd with wrist extention    Time 3   Period Weeks   Status On-going     OT SHORT TERM GOAL #3   Title Pt report increase ease with fisting and use of thumb during ADL's and IADL's    Baseline progresing - numbness limiting him   Time 3   Period Weeks   Status On-going     OT SHORT TERM GOAL #4   Title Pain on PRWHE improve with at least 20 points    Baseline pain at eval 32/50 at eval - progressing but increase last time because over did wrist ext at home    Time 3   Period Weeks   Status  On-going           OT Long Term Goals - 06/26/16 1020      OT LONG TERM GOAL #1   Title Grip strenght in R hand improve to  50 % compare to L to hold and carry 8 lbs or more    Baseline Grip 29, L 105   Time 4   Period Weeks   Status On-going     OT LONG TERM GOAL #2   Title Prehension strength improve with 3-5 lbs to turn knob, car key , cut food    Baseline see flowsheet    Time 4   Period Weeks   Status On-going     OT LONG TERM GOAL #3   Title Function score on PRWHE improve with 20 points    Baseline at eval score for function 30.5/50 - using it more but still impaired   Time 4   Period Weeks   Status On-going               Plan - 07/03/16 1403    Clinical Impression Statement Pt made progress in grip and prehension but wrist flexion same - pt report MD said scar tissue on dorsal hand - and setup to do nerve conduction test on Monday am - pt to see MD afterwards - will check if need to cont therapy or what plan is    Rehab Potential Good   Clinical Impairments Affecting Rehab Potential CTS more than 3 wks - last 3 wks worse prior to starting therapy    OT Frequency 2x / week   OT Duration 4 weeks   OT Treatment/Interventions Self-care/ADL training;Contrast Bath;Fluidtherapy;Ultrasound;Therapeutic exercise;Patient/family education;Splinting;Manual Therapy;Passive range of motion   Plan appt with MD after Nerve conduction on MOnday am    OT Home Exercise Plan see pt instruction    Consulted and Agree with Plan of Care Patient      Patient will benefit from skilled therapeutic intervention in order to improve the following deficits and impairments:  Impaired flexibility, Decreased range of motion, Decreased  coordination, Impaired sensation, Increased edema, Decreased knowledge of precautions, Pain, Impaired UE functional use, Decreased strength  Visit Diagnosis: Carpal tunnel syndrome, right upper limb  Muscle weakness (generalized)  Pain in right  wrist  Stiffness of right wrist, not elsewhere classified  Stiffness of right hand, not elsewhere classified    Problem List Patient Active Problem List   Diagnosis Date Noted  . DM (diabetes mellitus), type 1 ([place]) 05/31/2015  . Hyperlipidemia 05/31/2015  . Celiac disease 05/31/2015    Rosalyn Gess OTR/L,CLT  07/03/2016, 2:49 PM  [facility] [address] [place], [address] Phone: [phone]   Fax:  [phone]  Name: Dustin Ray MRN: [id number] Date of Birth: 06-27-1971

## 2016-07-03 NOTE — Patient Instructions (Signed)
Same HEP - ROM  Until nerve conduction

## 2016-07-04 LAB — BASIC METABOLIC PANEL
BUN/Creatinine Ratio: 27 — ABNORMAL HIGH (ref 9–20)
BUN: 29 mg/dL — AB (ref 6–24)
CO2: 26 mmol/L (ref 18–29)
CREATININE: 1.06 mg/dL (ref 0.76–1.27)
Calcium: 9.6 mg/dL (ref 8.7–10.2)
Chloride: 98 mmol/L (ref 96–106)
GFR calc Af Amer: 97 mL/min/{1.73_m2} (ref >59)
GFR calc non Af Amer: 84 mL/min/{1.73_m2} (ref >59)
GLUCOSE: 155 mg/dL — AB (ref 65–99)
Potassium: 4.6 mmol/L (ref 3.5–5.2)
Sodium: 139 mmol/L (ref 134–144)

## 2016-07-07 ENCOUNTER — Encounter: Payer: Self-pay | Admitting: Family Medicine

## 2016-07-10 ENCOUNTER — Ambulatory Visit: Payer: 59 | Admitting: Occupational Therapy

## 2016-07-10 DIAGNOSIS — G5601 Carpal tunnel syndrome, right upper limb: Secondary | ICD-10-CM

## 2016-07-10 DIAGNOSIS — M25641 Stiffness of right hand, not elsewhere classified: Secondary | ICD-10-CM

## 2016-07-10 DIAGNOSIS — M25631 Stiffness of right wrist, not elsewhere classified: Secondary | ICD-10-CM

## 2016-07-10 DIAGNOSIS — M6281 Muscle weakness (generalized): Secondary | ICD-10-CM

## 2016-07-10 DIAGNOSIS — M25531 Pain in right wrist: Secondary | ICD-10-CM

## 2016-07-10 NOTE — Therapy (Signed)
[facility] [address], [place], [address] Phone: [phone]   Fax:  [phone]  Occupational Therapy Treatment  Patient Details  Name: Dustin Ray MRN: [id number] Date of Birth: 1971/05/24 Referring Provider: Sabra Heck   Encounter Date: 07/10/2016      OT End of Session - 07/10/16 0857    Visit Number 9   Number of Visits 12   Date for OT Re-Evaluation 07/17/16   OT Start Time 0802   OT Stop Time 0847   OT Time Calculation (min) 45 min   Activity Tolerance Patient tolerated treatment well   Behavior During Therapy [facility] for tasks assessed/performed      Past Medical History:  Diagnosis Date  . Celiac disease   . Diabetes mellitus without complication ([place])   . Hyperlipidemia     Past Surgical History:  Procedure Laterality Date  . CERVICAL DISC SURGERY      There were no vitals filed for this visit.      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 Yes   Pain Score 1    Pain Location Wrist   Pain Orientation Right   Pain Descriptors / Indicators Aching            [facility] OT Assessment - 07/10/16 0001      AROM   Right Wrist Extension 56 Degrees   Right Wrist Flexion 28 Degrees     Strength   Right Hand Grip (lbs) 39   Right Hand Lateral Pinch 15 lbs   Right Hand 3 Point Pinch 14 lbs   Left Hand Grip (lbs) 104   Left Hand Lateral Pinch 22 lbs   Left Hand 3 Point Pinch 18 lbs                  OT Treatments/Exercises (OP) - 07/10/16 0001      RUE Contrast Bath   Time 11 minutes   Comments At [facility] to decrease CTS in R hand and wrist      measurements for ROM at  wrist and grip ,prehension  Grip improved  Graston tools - nr 4  And 2 over volar  wrist sweeping and brushing  To decrease numbness in 2nd and 3rd - use to be only in tips prior to nerve conduction  PROM for wrist extention  And flexion  Review and add Med N glides to do 5 reps at home             OT Education - 07/10/16 0856    Education provided Yes   Education Details HEP    Person(s) Educated Patient   Methods Explanation;Demonstration;Tactile cues;Verbal cues;Handout   Comprehension Verbal cues required;Returned demonstration;Verbalized understanding          OT Short Term Goals - 06/26/16 1018      OT SHORT TERM GOAL #1   Title R wrist AROM inmprove in all planes 10 -20 degrees to turn doorknob , push up from chair    Baseline see flowsheet - progressing   Time  4   Period Weeks   Status On-going     OT SHORT TERM GOAL #2   Title Pt report decrease in numbness and CTS in R hand doing HEP and modifications    Baseline numbness the last 3wks - before that was only tingling - numbness still in tips of thumb thru 3rd - and increase in 3rd with wrist extention    Time 3   Period Weeks   Status On-going     OT SHORT TERM GOAL #3   Title Pt report increase ease with fisting and use of thumb during ADL's and IADL's    Baseline progresing - numbness limiting him   Time 3   Period Weeks   Status On-going     OT SHORT TERM GOAL #4   Title Pain on PRWHE improve with at least 20 points    Baseline pain at eval 32/50 at eval - progressing but increase last time because over did wrist ext at home    Time 3   Period Weeks   Status On-going           OT Long Term Goals - 06/26/16 1020      OT LONG TERM GOAL #1   Title Grip strenght in R hand improve to  50 % compare to L to hold and carry 8 lbs or more    Baseline Grip 29, L 105   Time 4   Period Weeks   Status On-going     OT LONG TERM GOAL #2   Title Prehension strength improve with 3-5 lbs to turn knob,  car key , cut food    Baseline see flowsheet    Time 4   Period Weeks   Status On-going     OT LONG TERM GOAL #3   Title Function score on PRWHE improve with 20 points    Baseline at eval score for function 30.5/50 - using it more but still impaired   Time 4   Period Weeks   Status On-going               Plan - 07/10/16 0857    Clinical Impression Statement Pt had Nerve conduction test on Monday and shot in CT - Dr Sabra Heck said per pt that bone did not heal correct and if not improve in 10 days going to refer to hand surgeon in [place] - since the nerve conduction my index and middle fingers more numb down to middle joint    Rehab Potential Good   OT Frequency 1x / week   OT Duration 2 weeks   OT Treatment/Interventions Self-care/ADL training;Contrast Bath;Fluidtherapy;Ultrasound;Therapeutic exercise;Patient/family education;Splinting;Manual Therapy;Passive range of motion   Plan Pt to phone next Wed MD about progress in CT - if not will refer to hand surgeon- schedule pt for in week    OT Home Exercise Plan see pt instruction    Consulted and Agree with Plan of Care Patient      Patient will benefit from skilled therapeutic intervention in order to improve the following deficits and impairments:  Impaired flexibility, Decreased range of motion, Decreased coordination, Impaired sensation, Increased edema, Decreased knowledge of precautions, Pain, Impaired UE functional use, Decreased strength  Visit Diagnosis: Carpal tunnel syndrome, right upper limb  Muscle weakness (generalized)  Pain in right wrist  Stiffness of right wrist, not elsewhere classified  Stiffness of right hand, not elsewhere classified    Problem List Patient Active Problem List   Diagnosis Date Noted  .  DM (diabetes mellitus), type 1 ([place]) 05/31/2015  . Hyperlipidemia 05/31/2015  . Celiac disease 05/31/2015    Rosalyn Gess OTR/L,CLT  07/10/2016, 9:04 AM  [facility] [address] [place], [address] Phone: [phone]   Fax:  [phone]  Name: Dustin Ray MRN: [id number] Date of Birth: Mar 22, 1971

## 2016-07-10 NOTE — Patient Instructions (Signed)
Cont to maintain wrist flexion and ext, RD and UD  And add Med N  Glides 5 reps  2 x day

## 2016-07-16 ENCOUNTER — Ambulatory Visit: Payer: 59 | Admitting: Occupational Therapy

## 2016-07-16 DIAGNOSIS — G5601 Carpal tunnel syndrome, right upper limb: Secondary | ICD-10-CM

## 2016-07-16 DIAGNOSIS — M25631 Stiffness of right wrist, not elsewhere classified: Secondary | ICD-10-CM | POA: Diagnosis not present

## 2016-07-16 DIAGNOSIS — M25531 Pain in right wrist: Secondary | ICD-10-CM

## 2016-07-16 DIAGNOSIS — M6281 Muscle weakness (generalized): Secondary | ICD-10-CM

## 2016-07-16 DIAGNOSIS — M25641 Stiffness of right hand, not elsewhere classified: Secondary | ICD-10-CM

## 2016-07-16 NOTE — Therapy (Signed)
[facility] [address] [place], [address] Phone: [phone]   Fax:  [phone]  Occupational Therapy Treatment  Patient Details  Name: Dustin Ray MRN: [id number] Date of Birth: 18-Jan-1971 Referring Provider: Sabra Heck   Encounter Date: 07/16/2016      OT End of Session - 07/16/16 1153    Visit Number 10   Number of Visits 10   Date for OT Re-Evaluation 07/16/16   OT Start Time 1110   OT Stop Time 1144   OT Time Calculation (min) 34 min   Activity Tolerance Patient tolerated treatment well   Behavior During Therapy [facility] for tasks assessed/performed      Past Medical History:  Diagnosis Date  . Celiac disease   . Diabetes mellitus without complication ([place])   . Hyperlipidemia     Past Surgical History:  Procedure Laterality Date  . CERVICAL DISC SURGERY      There were no vitals filed for this visit.      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 Yes   Pain Score 2    Pain Location Wrist   Pain Orientation Right   Pain Descriptors / Indicators Aching            [facility] OT Assessment - 07/16/16 0001      AROM   Right Wrist Extension 57 Degrees   Right Wrist Flexion 33 Degrees   Right Wrist Radial Deviation 13 Degrees   Right Wrist Ulnar Deviation 23 Degrees     Strength   Right Hand Grip (lbs) 40   Right Hand Lateral Pinch 16 lbs   Right Hand 3 Point Pinch 14 lbs     PRWHE done - see goals for score - simulated some of the activities  Measurements taken - see flowsheet   Pt to cont with HEP for ROM  - appt with surgeon  next Friday                      OT Education - 07/16/16 1152    Education provided Yes   Education Details findings and HEP    Person(s) Educated Patient   Methods Explanation;Demonstration   Comprehension Verbalized understanding          OT Short Term Goals - 07/16/16 1156      OT SHORT TERM GOAL #1   Title R wrist AROM inmprove in all planes 10 -20 degrees to turn doorknob , push up from chair    Status Partially Met     OT [place] #2   Title Pt report decrease in numbness and CTS in R hand doing HEP and modifications    Baseline numbness the last 3wks - before that was only tingling - numbness still in tips of thumb thru 3rd - and increase in 3rd with wrist extention    Status Not Met     OT SHORT TERM GOAL #3   Title Pt report increase ease with fisting and use of thumb  during ADL's and IADL's    Baseline progresing - numbness limiting him   Status Partially Met     OT SHORT TERM GOAL #4   Title Pain on PRWHE improve with at least 20 points    Baseline pain at eval 32/50 at eval - pain increase to 39/50 today    Status Not Met           OT Long Term Goals - 07/16/16 1157      OT LONG TERM GOAL #1   Title Grip strenght in R hand improve to  50 % compare to L to hold and carry 8 lbs or more    Baseline Grip 40 R, L 105   Status Partially Met     OT LONG TERM GOAL #2   Title Prehension strength improve with 3-5 lbs to turn knob, car key , cut food    Baseline progress but still impaired    Status Partially Met     OT LONG TERM GOAL #3   Title Function score on PRWHE improve with 20 points    Baseline at eval score for function 30.5/50 - and now 39/50    Status Not Met               Plan - 07/16/16 1153    Clinical Impression Statement Pt made progress since [place] in ROM and grip /prehension but pain and functional use still  39/50 for function and pain on PRWHE - ROM about the same the last wks - and numbness still present ,  and ROM  feels like he runs into"block" and pain  increase - pt to see Dr Astrid Divine in hand surgoen in [place] next Friday    Clinical Impairments Affecting Rehab Potential CTS more than 3 wks - last 3 wks worse prior to starting therapy    OT Treatment/Interventions Self-care/ADL training;Contrast Bath;Fluidtherapy;Ultrasound;Therapeutic exercise;Patient/family education;Splinting;Manual Therapy;Passive range of motion   Plan pt to contace OT aftter appt with hand surgeon   OT Home Exercise Plan see pt instruction    Consulted and Agree with Plan of Care Patient      Patient will benefit from skilled therapeutic intervention in order to improve the following deficits and impairments:  Impaired flexibility, Decreased range of motion, Decreased coordination, Impaired sensation, Increased edema, Decreased knowledge of precautions, Pain, Impaired UE functional use, Decreased strength  Visit Diagnosis: Carpal tunnel syndrome, right upper limb  Muscle weakness (generalized)  Pain in right wrist  Stiffness of right wrist, not elsewhere classified  Stiffness of right hand, not elsewhere classified    Problem List Patient Active Problem List   Diagnosis Date Noted  . DM (diabetes mellitus), type 1 ([place]) 05/31/2015  . Hyperlipidemia 05/31/2015  . Celiac disease 05/31/2015    Rosalyn Gess OTR/L,CLT  07/16/2016, 11:58 AM  [facility] [address], [place], [address] Phone: [phone]   Fax:  [phone]  Name: Dustin Ray MRN: [id number] Date of Birth: 29-Jul-1971

## 2016-07-16 NOTE — Patient Instructions (Signed)
Cont with same HEP to increase and maintain ROM at wrist

## 2016-07-21 ENCOUNTER — Telehealth: Payer: Self-pay

## 2016-07-21 MED ORDER — ATORVASTATIN CALCIUM 40 MG PO TABS: 40.0000 mg | ORAL_TABLET | Freq: Every day | ORAL | 1 refills | Status: DC

## 2016-07-21 NOTE — Telephone Encounter (Signed)
Patient is having side effects (muscle aches) from Lovastatin.  He needs a different med,    Formerly took name brand Lipitor (Not Generic) and had no side effects  The reason he had stopped Lipitor was because his insurance would not cover.  He has new insurance now and would like to know if he could try Lipitor again.

## 2016-07-21 NOTE — Telephone Encounter (Signed)
Rx sent to his pharmacy

## 2016-07-29 ENCOUNTER — Other Ambulatory Visit: Payer: Self-pay | Admitting: Family Medicine

## 2016-07-29 ENCOUNTER — Telehealth: Payer: Self-pay

## 2016-07-29 MED ORDER — ATORVASTATIN CALCIUM 40 MG PO TABS: 40.0000 mg | ORAL_TABLET | Freq: Every day | ORAL | 1 refills | Status: DC

## 2016-07-29 NOTE — Telephone Encounter (Signed)
Your patient 

## 2016-07-29 NOTE — Telephone Encounter (Signed)
Patient's brand for lipitor was denied by insurance.  However, they will cover the generic version.   Pharmacy just said they need a new Rx for the generic version or something else.

## 2016-10-08 ENCOUNTER — Ambulatory Visit (INDEPENDENT_AMBULATORY_CARE_PROVIDER_SITE_OTHER): Payer: 59 | Admitting: Family Medicine

## 2016-10-08 ENCOUNTER — Encounter: Payer: Self-pay | Admitting: Family Medicine

## 2016-10-08 VITALS — BP 136/79 | HR 77 | Temp 98.3°F | Wt 155.7 lb

## 2016-10-08 DIAGNOSIS — E7800 Pure hypercholesterolemia, unspecified: Secondary | ICD-10-CM

## 2016-10-08 DIAGNOSIS — K900 Celiac disease: Secondary | ICD-10-CM | POA: Diagnosis not present

## 2016-10-08 DIAGNOSIS — E10638 Type 1 diabetes mellitus with other oral complications: Secondary | ICD-10-CM

## 2016-10-08 LAB — LP+ALT+AST PICCOLO, WAIVED
ALT (SGPT) PICCOLO, WAIVED: 20 U/L (ref 10–47)
AST (SGOT) Piccolo, Waived: 24 U/L (ref 11–38)
Chol/HDL Ratio Piccolo,Waive: 3.6 mg/dL
Cholesterol Piccolo, Waived: 157 mg/dL (ref <200)
HDL Chol Piccolo, Waived: 43 mg/dL — ABNORMAL LOW (ref >59)
LDL CHOL CALC PICCOLO WAIVED: 95 mg/dL (ref <100)
TRIGLYCERIDES PICCOLO,WAIVED: 92 mg/dL (ref <150)
VLDL CHOL CALC PICCOLO,WAIVE: 18 mg/dL (ref <30)

## 2016-10-08 LAB — BAYER DCA HB A1C WAIVED: HB A1C: 7.4 % — AB (ref <7.0)

## 2016-10-08 MED ORDER — PRAVASTATIN SODIUM 20 MG PO TABS: 20.0000 mg | ORAL_TABLET | Freq: Every day | ORAL | 3 refills | Status: DC

## 2016-10-08 NOTE — Assessment & Plan Note (Signed)
Cholesterol not at goal with LDL of 70 with drug intolerances reviewed will try low dose Pravachol 20 mg once a day

## 2016-10-08 NOTE — Progress Notes (Signed)
BP 136/79 (BP Location: Left Arm, Patient Position: Sitting, Cuff Size: Normal)   Pulse 77   Temp 98.3 F (36.8 C)   Wt 155 lb 11.2 oz (70.6 kg)   SpO2 97%   BMI 21.29 kg/m    Subjective:    Patient ID: Dustin Ray, male    DOB: 09-Mar-1971, 45 y.o.   MRN: [id number]  HPI: Dustin Ray is a 45 y.o. male  Chief Complaint  Patient presents with  . Diabetes  . Hyperlipidemia  Patient follow-up diabetes been using insulin without problems noted low blood sugar spells. Son unable to take Crestor and Mevacor due to myalgias. Was able to take Lipitor namebrand tried generic atorvastatin and had severe diarrhea and is unable to take that. Currently not taking any cholesterol medications as insurance of course will not pay for name brand Lipitor.  Relevant past medical, surgical, family and social history reviewed and updated as indicated. Interim medical history since our last visit reviewed. Allergies and medications reviewed and updated.  Review of Systems  Constitutional: Negative.   Respiratory: Negative.   Cardiovascular: Negative.     Per HPI unless specifically indicated above     Objective:    BP 136/79 (BP Location: Left Arm, Patient Position: Sitting, Cuff Size: Normal)   Pulse 77   Temp 98.3 F (36.8 C)   Wt 155 lb 11.2 oz (70.6 kg)   SpO2 97%   BMI 21.29 kg/m   Wt Readings from Last 3 Encounters:  10/08/16 155 lb 11.2 oz (70.6 kg)  07/03/16 143 lb (64.9 kg)  03/26/16 154 lb (69.9 kg)    Physical Exam  Constitutional: He is oriented to person, place, and time. He appears well-developed and well-nourished. No distress.  HENT:  Head: Normocephalic and atraumatic.  Right Ear: Hearing normal.  Left Ear: Hearing normal.  Nose: Nose normal.  Eyes: Conjunctivae and lids are normal. Right eye exhibits no discharge. Left eye exhibits no discharge. No scleral icterus.  Cardiovascular: Normal rate, regular rhythm and normal heart sounds.   Pulmonary/Chest:  Effort normal and breath sounds normal. No respiratory distress.  Musculoskeletal: Normal range of motion.  Neurological: He is alert and oriented to person, place, and time.  Skin: Skin is intact. No rash noted.  Psychiatric: He has a normal mood and affect. His speech is normal and behavior is normal. Judgment and thought content normal. Cognition and memory are normal.    Results for orders placed or performed in visit on 10/08/16  LP+ALT+AST Piccolo, [employer]  Result Value Ref Range   ALT (SGPT) Piccolo, Waived 20 10 - 47 U/L   AST (SGOT) Piccolo, Waived 24 11 - 38 U/L   Cholesterol Piccolo, Waived 157 <200 mg/dL   HDL Chol Piccolo, Waived 43 (L) >59 mg/dL   Triglycerides Piccolo,Waived 92 <150 mg/dL   Chol/HDL Ratio Piccolo,Waive 3.6 mg/dL   LDL Chol Calc Piccolo Waived 95 <100 mg/dL   VLDL Chol Calc Piccolo,Waive 18 <30 mg/dL  Bayer DCA Hb A1c Waived  Result Value Ref Range   Bayer DCA Hb A1c Waived 7.4 (H) <7.0 %      Assessment & Plan:   Problem List Items Addressed This Visit      Digestive   Celiac disease    The current medical regimen is effective;  continue present plan and medications.         Endocrine   DM (diabetes mellitus), type 1 ([place]) - Primary    The current  medical regimen is effective;  continue present plan and medications.       Relevant Medications   pravastatin (PRAVACHOL) 20 MG tablet   Other Relevant Orders   Basic metabolic panel   Bayer DCA Hb A1c Waived (Completed)     Other   Hyperlipidemia    Cholesterol not at goal with LDL of 70 with drug intolerances reviewed will try low dose Pravachol 20 mg once a day      Relevant Medications   pravastatin (PRAVACHOL) 20 MG tablet   Other Relevant Orders   LP+ALT+AST Piccolo, Waived (Completed)       Follow up plan: Return in about 3 months (around 01/08/2017) for Hemoglobin A1c.

## 2016-10-08 NOTE — Assessment & Plan Note (Signed)
The current medical regimen is effective;  continue present plan and medications.  

## 2016-10-09 ENCOUNTER — Encounter: Payer: Self-pay | Admitting: Family Medicine

## 2016-10-09 LAB — BASIC METABOLIC PANEL
BUN / CREAT RATIO: 19 (ref 9–20)
BUN: 18 mg/dL (ref 6–24)
CO2: 27 mmol/L (ref 18–29)
Calcium: 9.9 mg/dL (ref 8.7–10.2)
Chloride: 99 mmol/L (ref 96–106)
Creatinine, Ser: 0.96 mg/dL (ref 0.76–1.27)
GFR calc Af Amer: 109 mL/min/{1.73_m2} (ref >59)
GFR calc non Af Amer: 94 mL/min/{1.73_m2} (ref >59)
GLUCOSE: 49 mg/dL — AB (ref 65–99)
POTASSIUM: 4.2 mmol/L (ref 3.5–5.2)
SODIUM: 143 mmol/L (ref 134–144)

## 2016-12-30 ENCOUNTER — Ambulatory Visit (INDEPENDENT_AMBULATORY_CARE_PROVIDER_SITE_OTHER): Payer: 59 | Admitting: Family Medicine

## 2016-12-30 ENCOUNTER — Encounter: Payer: Self-pay | Admitting: Family Medicine

## 2016-12-30 VITALS — BP 124/75 | HR 67 | Temp 97.4°F | Ht 73.43 in | Wt 164.0 lb

## 2016-12-30 DIAGNOSIS — E109 Type 1 diabetes mellitus without complications: Secondary | ICD-10-CM | POA: Diagnosis not present

## 2016-12-30 DIAGNOSIS — Z125 Encounter for screening for malignant neoplasm of prostate: Secondary | ICD-10-CM | POA: Diagnosis not present

## 2016-12-30 DIAGNOSIS — E7800 Pure hypercholesterolemia, unspecified: Secondary | ICD-10-CM

## 2016-12-30 DIAGNOSIS — Z0000 Encounter for general adult medical examination without abnormal findings: Secondary | ICD-10-CM | POA: Diagnosis not present

## 2016-12-30 DIAGNOSIS — K900 Celiac disease: Secondary | ICD-10-CM

## 2016-12-30 DIAGNOSIS — E10638 Type 1 diabetes mellitus with other oral complications: Secondary | ICD-10-CM | POA: Diagnosis not present

## 2016-12-30 DIAGNOSIS — Z1329 Encounter for screening for other suspected endocrine disorder: Secondary | ICD-10-CM

## 2016-12-30 LAB — URINALYSIS, ROUTINE W REFLEX MICROSCOPIC
Bilirubin, UA: NEGATIVE
GLUCOSE, UA: NEGATIVE
Ketones, UA: NEGATIVE
Leukocytes, UA: NEGATIVE
NITRITE UA: NEGATIVE
PH UA: 6 (ref 5.0–7.5)
Protein, UA: NEGATIVE
RBC, UA: NEGATIVE
Specific Gravity, UA: 1.015 (ref 1.005–1.030)
UUROB: 0.2 mg/dL (ref 0.2–1.0)

## 2016-12-30 LAB — BAYER DCA HB A1C WAIVED: HB A1C: 6.4 % (ref <7.0)

## 2016-12-30 MED ORDER — INSULIN PEN NEEDLE 31G X 8 MM MISC: 12 refills | Status: DC

## 2016-12-30 MED ORDER — PITAVASTATIN CALCIUM 1 MG PO TABS: 1.0000 mg | ORAL_TABLET | Freq: Every day | ORAL | 12 refills | Status: DC

## 2016-12-30 MED ORDER — INSULIN ASPART 100 UNIT/ML FLEXPEN: 20.0000 [IU] | PEN_INJECTOR | Freq: Three times a day (TID) | SUBCUTANEOUS | 12 refills | Status: DC

## 2016-12-30 MED ORDER — INSULIN GLARGINE 300 UNIT/ML ~~LOC~~ SOPN: 36.0000 [IU] | PEN_INJECTOR | Freq: Every day | SUBCUTANEOUS | 12 refills | Status: DC

## 2016-12-30 NOTE — Assessment & Plan Note (Signed)
The current medical regimen is effective;  continue present plan and medications.  

## 2016-12-30 NOTE — Assessment & Plan Note (Addendum)
Currently off medications due to intolerance to statins discussed use of livialo We will use low-dose 1 mg as frequently as every other day. Patient will try to get by with this otherwise may need to consider paying cash for Lipitor as he is able to take name brand but will break in half and take sparingly. We'll reassess in 3 months or sooner by phone.

## 2016-12-30 NOTE — Progress Notes (Signed)
BP 124/75   Pulse 67   Temp 97.4 F (36.3 C) (Oral)   Ht 6' 1.43" (1.865 m)   Wt 164 lb (74.4 kg)   SpO2 99%   BMI 21.39 kg/m    Subjective:    Patient ID: Dustin Ray, male    DOB: [birth year], 46 y.o.   MRN: [id number]  HPI: Dustin Ray is a 46 y.o. male  Chief Complaint  Patient presents with  . Annual Exam   Patient also follow-up celiac doing well bowels been stable with no issues. Diabetes doing well with no complaints with insulin taken faithfully no low blood sugar spells or problems. Hypercholesterol unable to take Pravachol as after 2 weeks developed marked myalgias. Relevant past medical, surgical, family and social history reviewed and updated as indicated. Interim medical history since our last visit reviewed. Allergies and medications reviewed and updated.  Review of Systems  Constitutional: Negative.   HENT: Negative.   Eyes: Negative.   Respiratory: Negative.   Cardiovascular: Negative.   Gastrointestinal: Negative.   Endocrine: Negative.   Genitourinary: Negative.   Musculoskeletal: Negative.   Skin: Negative.   Allergic/Immunologic: Negative.   Neurological: Negative.   Hematological: Negative.   Psychiatric/Behavioral: Negative.     Per HPI unless specifically indicated above     Objective:    BP 124/75   Pulse 67   Temp 97.4 F (36.3 C) (Oral)   Ht 6' 1.43" (1.865 m)   Wt 164 lb (74.4 kg)   SpO2 99%   BMI 21.39 kg/m   Wt Readings from Last 3 Encounters:  12/30/16 164 lb (74.4 kg)  10/08/16 155 lb 11.2 oz (70.6 kg)  07/03/16 143 lb (64.9 kg)    Physical Exam  Constitutional: He is oriented to person, place, and time. He appears well-developed and well-nourished.  HENT:  Head: Normocephalic and atraumatic.  Right Ear: External ear normal.  Left Ear: External ear normal.  Nose: Nose normal.  Mouth/Throat: Oropharynx is clear and moist.  Eyes: Conjunctivae and EOM are normal. Pupils are equal, round, and reactive to light.   Neck: Normal range of motion. Neck supple.  Cardiovascular: Normal rate, regular rhythm, normal heart sounds and intact distal pulses.   Pulmonary/Chest: Effort normal and breath sounds normal.  Abdominal: Soft. Bowel sounds are normal. There is no splenomegaly or hepatomegaly.  Genitourinary: Rectum normal, prostate normal and penis normal.  Musculoskeletal: Normal range of motion.  Neurological: He is alert and oriented to person, place, and time. He has normal reflexes.  Skin: No rash noted. No erythema.  Psychiatric: He has a normal mood and affect. His behavior is normal. Judgment and thought content normal.    Results for orders placed or performed in visit on 27/07/86  Basic metabolic panel  Result Value Ref Range   Glucose 49 (L) 65 - 99 mg/dL   BUN 18 6 - 24 mg/dL   Creatinine, Ser 0.96 0.76 - 1.27 mg/dL   GFR calc non Af Amer 94 >59 mL/min/1.73   GFR calc Af Amer 109 >59 mL/min/1.73   BUN/Creatinine Ratio 19 9 - 20   Sodium 143 134 - 144 mmol/L   Potassium 4.2 3.5 - 5.2 mmol/L   Chloride 99 96 - 106 mmol/L   CO2 27 18 - 29 mmol/L   Calcium 9.9 8.7 - 10.2 mg/dL  LP+ALT+AST Piccolo, Waived  Result Value Ref Range   ALT (SGPT) Piccolo, Waived 20 10 - 47 U/L   AST (SGOT) Piccolo,  Waived 24 11 - 38 U/L   Cholesterol Piccolo, Waived 157 <200 mg/dL   HDL Chol Piccolo, Waived 43 (L) >59 mg/dL   Triglycerides Piccolo,Waived 92 <150 mg/dL   Chol/HDL Ratio Piccolo,Waive 3.6 mg/dL   LDL Chol Calc Piccolo Waived 95 <100 mg/dL   VLDL Chol Calc Piccolo,Waive 18 <30 mg/dL  Bayer DCA Hb A1c Waived  Result Value Ref Range   Bayer DCA Hb A1c Waived 7.4 (H) <7.0 %      Assessment & Plan:   Problem List Items Addressed This Visit      Digestive   Celiac disease    The current medical regimen is effective;  continue present plan and medications.         Endocrine   DM (diabetes mellitus), type 1 ([place])    The current medical regimen is effective;  continue present plan and  medications.       Relevant Medications   Insulin Glargine (TOUJEO SOLOSTAR) 300 UNIT/ML SOPN   insulin aspart (NOVOLOG FLEXPEN) 100 UNIT/ML FlexPen   Insulin Pen Needle (B-D ULTRAFINE III SHORT PEN) 31G X 8 MM MISC   Pitavastatin Calcium (LIVALO) 1 MG TABS   Other Relevant Orders   CBC with Differential/Platelet   Comprehensive metabolic panel   Urinalysis, Routine w reflex microscopic   Bayer DCA Hb A1c Waived (STAT)     Other   Hyperlipidemia    Currently off medications due to intolerance to statins discussed use of livialo We will use low-dose 1 mg as frequently as every other day. Patient will try to get by with this otherwise may need to consider paying cash for Lipitor as he is able to take name brand but will break in half and take sparingly. We'll reassess in 3 months or sooner by phone.      Relevant Medications   Pitavastatin Calcium (LIVALO) 1 MG TABS   Other Relevant Orders   CBC with Differential/Platelet   Comprehensive metabolic panel   Lipid panel   Urinalysis, Routine w reflex microscopic    Other Visit Diagnoses    Annual physical exam    -  Primary   Relevant Orders   CBC with Differential/Platelet   Comprehensive metabolic panel   Lipid panel   PSA   TSH   Urinalysis, Routine w reflex microscopic   Bayer DCA Hb A1c Waived (STAT)   Prostate cancer screening       Relevant Orders   PSA   Thyroid disorder screen       Relevant Orders   TSH       Follow up plan: Return for Hemoglobin A1c,  Lipids, ALT, AST.

## 2016-12-31 ENCOUNTER — Telehealth: Payer: Self-pay

## 2016-12-31 ENCOUNTER — Encounter: Payer: Self-pay | Admitting: Family Medicine

## 2016-12-31 LAB — LIPID PANEL
CHOL/HDL RATIO: 3.9 ratio (ref 0.0–5.0)
CHOLESTEROL TOTAL: 158 mg/dL (ref 100–199)
HDL: 41 mg/dL (ref >39)
LDL Calculated: 89 mg/dL (ref 0–99)
TRIGLYCERIDES: 142 mg/dL (ref 0–149)
VLDL Cholesterol Cal: 28 mg/dL (ref 5–40)

## 2016-12-31 LAB — COMPREHENSIVE METABOLIC PANEL
A/G RATIO: 2 (ref 1.2–2.2)
ALK PHOS: 79 IU/L (ref 39–117)
ALT: 15 IU/L (ref 0–44)
AST: 21 IU/L (ref 0–40)
Albumin: 4.3 g/dL (ref 3.5–5.5)
BUN/Creatinine Ratio: 19 (ref 9–20)
BUN: 20 mg/dL (ref 6–24)
Bilirubin Total: <0.2 mg/dL (ref 0.0–1.2)
CALCIUM: 9.7 mg/dL (ref 8.7–10.2)
CO2: 28 mmol/L (ref 18–29)
Chloride: 101 mmol/L (ref 96–106)
Creatinine, Ser: 1.05 mg/dL (ref 0.76–1.27)
GFR calc Af Amer: 97 mL/min/{1.73_m2} (ref >59)
GFR, EST NON AFRICAN AMERICAN: 84 mL/min/{1.73_m2} (ref >59)
GLOBULIN, TOTAL: 2.1 g/dL (ref 1.5–4.5)
Glucose: 102 mg/dL — ABNORMAL HIGH (ref 65–99)
POTASSIUM: 4.4 mmol/L (ref 3.5–5.2)
SODIUM: 142 mmol/L (ref 134–144)
Total Protein: 6.4 g/dL (ref 6.0–8.5)

## 2016-12-31 LAB — CBC WITH DIFFERENTIAL/PLATELET
Basophils Absolute: 0.1 10*3/uL (ref 0.0–0.2)
Basos: 1 %
EOS (ABSOLUTE): 0.3 10*3/uL (ref 0.0–0.4)
Eos: 3 %
Hematocrit: 45.5 % (ref 37.5–51.0)
Hemoglobin: 15.4 g/dL (ref 13.0–17.7)
IMMATURE GRANULOCYTES: 0 %
Immature Grans (Abs): 0 10*3/uL (ref 0.0–0.1)
LYMPHS ABS: 1.5 10*3/uL (ref 0.7–3.1)
Lymphs: 17 %
MCH: 31 pg (ref 26.6–33.0)
MCHC: 33.8 g/dL (ref 31.5–35.7)
MCV: 92 fL (ref 79–97)
MONOS ABS: 0.9 10*3/uL (ref 0.1–0.9)
Monocytes: 10 %
NEUTROS PCT: 69 %
Neutrophils Absolute: 6 10*3/uL (ref 1.4–7.0)
PLATELETS: 291 10*3/uL (ref 150–379)
RBC: 4.96 x10E6/uL (ref 4.14–5.80)
RDW: 13.1 % (ref 12.3–15.4)
WBC: 8.7 10*3/uL (ref 3.4–10.8)

## 2016-12-31 LAB — TSH: TSH: 2.87 u[IU]/mL (ref 0.450–4.500)

## 2016-12-31 LAB — PSA: Prostate Specific Ag, Serum: 0.6 ng/mL (ref 0.0–4.0)

## 2016-12-31 NOTE — Telephone Encounter (Signed)
Prior authorization was initiated via [email or web address] Key: [id number].  Medication was Livalo.

## 2017-03-31 ENCOUNTER — Ambulatory Visit (INDEPENDENT_AMBULATORY_CARE_PROVIDER_SITE_OTHER): Payer: BLUE CROSS/BLUE SHIELD | Admitting: Family Medicine

## 2017-03-31 ENCOUNTER — Encounter: Payer: Self-pay | Admitting: Family Medicine

## 2017-03-31 VITALS — BP 120/74 | HR 64 | Wt 155.0 lb

## 2017-03-31 DIAGNOSIS — E7800 Pure hypercholesterolemia, unspecified: Secondary | ICD-10-CM

## 2017-03-31 DIAGNOSIS — K900 Celiac disease: Secondary | ICD-10-CM | POA: Diagnosis not present

## 2017-03-31 DIAGNOSIS — E10638 Type 1 diabetes mellitus with other oral complications: Secondary | ICD-10-CM | POA: Diagnosis not present

## 2017-03-31 NOTE — Assessment & Plan Note (Signed)
The current medical regimen is effective;  continue present plan and medications.  

## 2017-03-31 NOTE — Progress Notes (Signed)
BP 120/74   Pulse 64   Wt 155 lb (70.3 kg)   SpO2 97%   BMI 20.21 kg/m    Subjective:    Patient ID: Dustin Ray, male    DOB: 01/05/1971, 46 y.o.   MRN: [id number]  HPI: Dustin Ray is a 46 y.o. male  Chief Complaint  Patient presents with  . Follow-up  . Diabetes  Diabetes been doing well last A1c was 6.4 patient feels is similar with similar control. No low blood sugar spells or issues. Taking Livalo for cholesterol without problems. Taking every other day with no problems. Blood pressure also doing well with no concerns or issues Biggest problem is insurance hassles Celiac is been stable.  Relevant past medical, surgical, family and social history reviewed and updated as indicated. Interim medical history since our last visit reviewed. Allergies and medications reviewed and updated.  Review of Systems  Constitutional: Negative.   Respiratory: Negative.   Cardiovascular: Negative.     Per HPI unless specifically indicated above     Objective:    BP 120/74   Pulse 64   Wt 155 lb (70.3 kg)   SpO2 97%   BMI 20.21 kg/m   Wt Readings from Last 3 Encounters:  03/31/17 155 lb (70.3 kg)  12/30/16 164 lb (74.4 kg)  10/08/16 155 lb 11.2 oz (70.6 kg)    Physical Exam  Constitutional: He is oriented to person, place, and time. He appears well-developed and well-nourished.  HENT:  Head: Normocephalic and atraumatic.  Eyes: Conjunctivae and EOM are normal.  Neck: Normal range of motion.  Cardiovascular: Normal rate, regular rhythm and normal heart sounds.   Pulmonary/Chest: Effort normal and breath sounds normal.  Musculoskeletal: Normal range of motion.  Neurological: He is alert and oriented to person, place, and time.  Skin: No erythema.  Psychiatric: He has a normal mood and affect. His behavior is normal. Judgment and thought content normal.    Results for orders placed or performed in visit on 12/30/16  CBC with Differential/Platelet  Result Value  Ref Range   WBC 8.7 3.4 - 10.8 x10E3/uL   RBC 4.96 4.14 - 5.80 x10E6/uL   Hemoglobin 15.4 13.0 - 17.7 g/dL   Hematocrit 45.5 37.5 - 51.0 %   MCV 92 79 - 97 fL   MCH 31.0 26.6 - 33.0 pg   MCHC 33.8 31.5 - 35.7 g/dL   RDW 13.1 12.3 - 15.4 %   Platelets 291 150 - 379 x10E3/uL   Neutrophils 69 Not Estab. %   Lymphs 17 Not Estab. %   Monocytes 10 Not Estab. %   Eos 3 Not Estab. %   Basos 1 Not Estab. %   Neutrophils Absolute 6.0 1.4 - 7.0 x10E3/uL   Lymphocytes Absolute 1.5 0.7 - 3.1 x10E3/uL   Monocytes Absolute 0.9 0.1 - 0.9 x10E3/uL   EOS (ABSOLUTE) 0.3 0.0 - 0.4 x10E3/uL   Basophils Absolute 0.1 0.0 - 0.2 x10E3/uL   Immature Granulocytes 0 Not Estab. %   Immature Grans (Abs) 0.0 0.0 - 0.1 x10E3/uL  Comprehensive metabolic panel  Result Value Ref Range   Glucose 102 (H) 65 - 99 mg/dL   BUN 20 6 - 24 mg/dL   Creatinine, Ser 1.05 0.76 - 1.27 mg/dL   GFR calc non Af Amer 84 >59 mL/min/1.73   GFR calc Af Amer 97 >59 mL/min/1.73   BUN/Creatinine Ratio 19 9 - 20   Sodium 142 134 - 144 mmol/L  Potassium 4.4 3.5 - 5.2 mmol/L   Chloride 101 96 - 106 mmol/L   CO2 28 18 - 29 mmol/L   Calcium 9.7 8.7 - 10.2 mg/dL   Total Protein 6.4 6.0 - 8.5 g/dL   Albumin 4.3 3.5 - 5.5 g/dL   Globulin, Total 2.1 1.5 - 4.5 g/dL   Albumin/Globulin Ratio 2.0 1.2 - 2.2   Bilirubin Total <0.2 0.0 - 1.2 mg/dL   Alkaline Phosphatase 79 39 - 117 IU/L   AST 21 0 - 40 IU/L   ALT 15 0 - 44 IU/L  Lipid panel  Result Value Ref Range   Cholesterol, Total 158 100 - 199 mg/dL   Triglycerides 142 0 - 149 mg/dL   HDL 41 >39 mg/dL   VLDL Cholesterol Cal 28 5 - 40 mg/dL   LDL Calculated 89 0 - 99 mg/dL   Chol/HDL Ratio 3.9 0.0 - 5.0 ratio units  PSA  Result Value Ref Range   Prostate Specific Ag, Serum 0.6 0.0 - 4.0 ng/mL  TSH  Result Value Ref Range   TSH 2.870 0.450 - 4.500 uIU/mL  Urinalysis, Routine w reflex microscopic  Result Value Ref Range   Specific Gravity, UA 1.015 1.005 - 1.030   pH, UA 6.0  5.0 - 7.5   Color, UA Yellow Yellow   Appearance Ur Clear Clear   Leukocytes, UA Negative Negative   Protein, UA Negative Negative/Trace   Glucose, UA Negative Negative   Ketones, UA Negative Negative   RBC, UA Negative Negative   Bilirubin, UA Negative Negative   Urobilinogen, Ur 0.2 0.2 - 1.0 mg/dL   Nitrite, UA Negative Negative  Bayer DCA Hb A1c Waived (STAT)  Result Value Ref Range   Bayer DCA Hb A1c Waived 6.4 <7.0 %      Assessment & Plan:   Problem List Items Addressed This Visit      Digestive   Celiac disease    The current medical regimen is effective;  continue present plan and medications.         Endocrine   DM (diabetes mellitus), type 1 ([place]) - Primary    The current medical regimen is effective;  continue present plan and medications.       Relevant Orders   Bayer DCA Hb A1c Waived   LP+ALT+AST Piccolo, Waived   Microalbumin, Urine Waived     Other   Hyperlipidemia    The current medical regimen is effective;  continue present plan and medications.       Relevant Orders   Bayer DCA Hb A1c Waived   LP+ALT+AST Piccolo, Waived   Microalbumin, Urine Waived       Follow up plan: Return in about 3 months (around 07/01/2017) for Hemoglobin A1c, BMP.

## 2017-04-01 ENCOUNTER — Encounter: Payer: Self-pay | Admitting: Family Medicine

## 2017-04-01 LAB — LP+ALT+AST PICCOLO, WAIVED
ALT (SGPT) PICCOLO, WAIVED: 21 U/L (ref 10–47)
AST (SGOT) PICCOLO, WAIVED: 31 U/L (ref 11–38)
Chol/HDL Ratio Piccolo,Waive: 5 mg/dL — ABNORMAL HIGH
Cholesterol Piccolo, Waived: 150 mg/dL (ref <200)
HDL Chol Piccolo, Waived: 30 mg/dL — ABNORMAL LOW (ref >59)
LDL Chol Calc Piccolo Waived: 69 mg/dL (ref <100)
Triglycerides Piccolo,Waived: 257 mg/dL — ABNORMAL HIGH (ref <150)
VLDL CHOL CALC PICCOLO,WAIVE: 51 mg/dL — AB (ref <30)

## 2017-04-01 LAB — BAYER DCA HB A1C WAIVED: HB A1C: 7 % — AB (ref <7.0)

## 2017-04-01 LAB — MICROALBUMIN, URINE WAIVED
Creatinine, Urine Waived: 100 mg/dL (ref 10–300)
MICROALB, UR WAIVED: 10 mg/L (ref 0–19)
Microalb/Creat Ratio: <30 mg/g (ref <30)

## 2017-06-08 ENCOUNTER — Encounter: Payer: Self-pay | Admitting: Family Medicine

## 2017-06-16 ENCOUNTER — Telehealth: Payer: Self-pay

## 2017-06-16 NOTE — Telephone Encounter (Signed)
P.A for Nelva Nay was initiated via [email or web address]  Pranav Alcindor Key: [id number] Need help? Call us at [phone] Status Sent to [place] 300UNIT/ML [place] SOPN

## 2017-07-02 ENCOUNTER — Ambulatory Visit: Payer: BLUE CROSS/BLUE SHIELD | Admitting: Family Medicine

## 2017-07-06 ENCOUNTER — Ambulatory Visit (INDEPENDENT_AMBULATORY_CARE_PROVIDER_SITE_OTHER): Payer: PRIVATE HEALTH INSURANCE | Admitting: Family Medicine

## 2017-07-06 VITALS — BP 104/61 | HR 64 | Wt 150.0 lb

## 2017-07-06 DIAGNOSIS — E10638 Type 1 diabetes mellitus with other oral complications: Secondary | ICD-10-CM

## 2017-07-06 DIAGNOSIS — K900 Celiac disease: Secondary | ICD-10-CM

## 2017-07-06 DIAGNOSIS — E7800 Pure hypercholesterolemia, unspecified: Secondary | ICD-10-CM | POA: Diagnosis not present

## 2017-07-06 NOTE — Assessment & Plan Note (Signed)
Doing okay as long as he stays on his diet.

## 2017-07-06 NOTE — Assessment & Plan Note (Signed)
Gave patient extensive samples of extended-release insulin and an immediate release insulin. Discussed importance of never running out and life-threatening nature of running out.

## 2017-07-06 NOTE — Progress Notes (Signed)
BP 104/61   Pulse 64   Wt 150 lb (68 kg)   SpO2 98%   BMI 19.56 kg/m    Subjective:    Patient ID: Dustin Ray, male    DOB: 1971-06-09, 46 y.o.   MRN: [id number]  HPI: Dustin Ray is a 46 y.o. male  Type 1 diabetes follow-up. Patient's biggest problem is insurance issues doesn't have insurance that would cover any medications at this point. Has been running out of insulin still has some NovoLog and his been taking that. Patient aware of serious nature of type 1 diabetes and quickness of real expensive problems  Relevant past medical, surgical, family and social history reviewed and updated as indicated. Interim medical history since our last visit reviewed. Allergies and medications reviewed and updated.  Review of Systems  Constitutional: Negative.   Respiratory: Negative.   Cardiovascular: Negative.     Per HPI unless specifically indicated above     Objective:    BP 104/61   Pulse 64   Wt 150 lb (68 kg)   SpO2 98%   BMI 19.56 kg/m   Wt Readings from Last 3 Encounters:  07/06/17 150 lb (68 kg)  03/31/17 155 lb (70.3 kg)  12/30/16 164 lb (74.4 kg)    Physical Exam  Constitutional: He is oriented to person, place, and time. He appears well-developed and well-nourished.  HENT:  Head: Normocephalic and atraumatic.  Eyes: Conjunctivae and EOM are normal.  Neck: Normal range of motion.  Cardiovascular: Normal rate, regular rhythm and normal heart sounds.   Pulmonary/Chest: Effort normal and breath sounds normal.  Musculoskeletal: Normal range of motion.  Neurological: He is alert and oriented to person, place, and time.  Skin: No erythema.  Psychiatric: He has a normal mood and affect. His behavior is normal. Judgment and thought content normal.    Results for orders placed or performed in visit on 03/31/17  Bayer DCA Hb A1c Waived  Result Value Ref Range   Bayer DCA Hb A1c Waived 7.0 (H) <7.0 %  LP+ALT+AST Piccolo, Waived  Result Value Ref Range   ALT (SGPT) Piccolo, Waived 21 10 - 47 U/L   AST (SGOT) Piccolo, Waived 31 11 - 38 U/L   Cholesterol Piccolo, Waived 150 <200 mg/dL   HDL Chol Piccolo, Waived 30 (L) >59 mg/dL   Triglycerides Piccolo,Waived 257 (H) <150 mg/dL   Chol/HDL Ratio Piccolo,Waive 5.0 (H) mg/dL   LDL Chol Calc Piccolo Waived 69 <100 mg/dL   VLDL Chol Calc Piccolo,Waive 51 (H) <30 mg/dL  Microalbumin, Urine Waived  Result Value Ref Range   Microalb, Ur Waived 10 0 - 19 mg/L   Creatinine, Urine Waived 100 10 - 300 mg/dL   Microalb/Creat Ratio <30 <30 mg/g      Assessment & Plan:   Problem List Items Addressed This Visit      Digestive   Celiac disease    Doing okay as long as he stays on his diet.        Endocrine   DM (diabetes mellitus), type 1 ([place]) - Primary    Gave patient extensive samples of extended-release insulin and an immediate release insulin. Discussed importance of never running out and life-threatening nature of running out.      Relevant Orders   Bayer DCA Hb A1c Waived   Basic metabolic panel   LP+ALT+AST Piccolo, Waived     Other   Hyperlipidemia   Relevant Orders   Bayer DCA Hb A1c Waived  Basic metabolic panel   LP+ALT+AST Piccolo, Waived       Follow up plan: Return in about 3 months (around 10/06/2017) for Hemoglobin A1c.

## 2017-07-07 ENCOUNTER — Encounter: Payer: Self-pay | Admitting: Family Medicine

## 2017-07-07 LAB — BAYER DCA HB A1C WAIVED: HB A1C (BAYER DCA - WAIVED): 8.4 % — ABNORMAL HIGH (ref <7.0)

## 2017-07-07 LAB — BASIC METABOLIC PANEL
BUN/Creatinine Ratio: 16 (ref 9–20)
BUN: 16 mg/dL (ref 6–24)
CALCIUM: 9.2 mg/dL (ref 8.7–10.2)
CO2: 27 mmol/L (ref 20–29)
Chloride: 100 mmol/L (ref 96–106)
Creatinine, Ser: 1.01 mg/dL (ref 0.76–1.27)
GFR calc Af Amer: 102 mL/min/{1.73_m2} (ref >59)
GFR, EST NON AFRICAN AMERICAN: 88 mL/min/{1.73_m2} (ref >59)
Glucose: 129 mg/dL — ABNORMAL HIGH (ref 65–99)
Potassium: 4.6 mmol/L (ref 3.5–5.2)
Sodium: 139 mmol/L (ref 134–144)

## 2017-07-07 LAB — LP+ALT+AST PICCOLO, WAIVED
ALT (SGPT) Piccolo, Waived: 23 U/L (ref 10–47)
AST (SGOT) PICCOLO, WAIVED: 20 U/L (ref 11–38)
CHOL/HDL RATIO PICCOLO,WAIVE: 5 mg/dL — AB
Cholesterol Piccolo, Waived: 133 mg/dL (ref <200)
HDL Chol Piccolo, Waived: 27 mg/dL — ABNORMAL LOW (ref >59)
LDL Chol Calc Piccolo Waived: 73 mg/dL (ref <100)
TRIGLYCERIDES PICCOLO,WAIVED: 169 mg/dL — AB (ref <150)
VLDL Chol Calc Piccolo,Waive: 34 mg/dL — ABNORMAL HIGH (ref <30)

## 2017-10-07 ENCOUNTER — Ambulatory Visit: Payer: Self-pay | Admitting: Family Medicine

## 2017-11-19 ENCOUNTER — Encounter: Payer: Self-pay | Admitting: Family Medicine

## 2017-11-23 ENCOUNTER — Telehealth: Payer: Self-pay | Admitting: Family Medicine

## 2017-11-23 NOTE — Telephone Encounter (Signed)
Patient would like to discuss this with Dr.Crissman, when he returns.

## 2017-11-23 NOTE — Telephone Encounter (Signed)
Dr.Artus, this is what I saw from [facility] note Endocrine    DM (diabetes mellitus), type 1 (Dustin Ray) - Primary    Gave patient extensive samples of extended-release insulin and an immediate release insulin. Discussed importance of never running out and life-threatening nature of running out.      Patient is also past due for an appt, but seems like patient does not have insurance at this time.    I think patient is confused, since one is a long acting and the other is short acting.

## 2017-11-23 NOTE — Telephone Encounter (Signed)
He needs to be on both medications. He needs to also have an appointment- if he can't afford to come here, [facility]. Really needs follow up.

## 2017-11-23 NOTE — Telephone Encounter (Signed)
Copied from [place] [id number]. Topic: Quick Communication - See Telephone Encounter >> Nov 23, 2017 11:21 AM Boyd Kerbs wrote: CRM for notification. See Telephone encounter for:  He is needing to switch back to Humulin Insulin Pen and syringes 8m 31 gage and called into (not [facility] - he does not ins. [facility]  [place] [id number]: [phone]: [phone]  11/23/17.

## 2017-11-23 NOTE — Telephone Encounter (Signed)
I don't entirely know what this means? Humalin and toujeo are not the same thing though.

## 2017-11-30 ENCOUNTER — Other Ambulatory Visit: Payer: Self-pay | Admitting: Family Medicine

## 2017-11-30 MED ORDER — INSULIN GLARGINE 100 UNIT/ML SOLOSTAR PEN: 36.0000 [IU] | PEN_INJECTOR | Freq: Every day | SUBCUTANEOUS | 99 refills | Status: DC

## 2017-11-30 NOTE — Telephone Encounter (Signed)
Patient was transferred to provider for telephone conversation.

## 2017-12-02 ENCOUNTER — Telehealth: Payer: Self-pay | Admitting: Family Medicine

## 2017-12-02 MED ORDER — INSULIN GLARGINE 100 UNIT/ML SOLOSTAR PEN: 36.0000 [IU] | PEN_INJECTOR | Freq: Every day | SUBCUTANEOUS | 99 refills | Status: DC

## 2017-12-02 NOTE — Telephone Encounter (Signed)
Copied from [place] [id number]. Topic: Quick Communication - Rx Refill/Question >> Dec 02, 2017  9:10 AM Eulogio Bear J wrote: Medication: new insulin  - Dr Jeananne Rama called and said that he would send different insulin rx on 11/30/17, lantus solostar states transmission to pharmacy failed     Has the patient contacted their pharmacy? Yes.     (Agent: If no, request that the patient contact the pharmacy for the refill.)   Preferred Pharmacy (with phone number or street name):  [facility] [address]    Agent: Please be advised that RX refills may take up to 3 business days. We ask that you follow-up with your pharmacy. Pt cb number is [phone]

## 2017-12-03 ENCOUNTER — Telehealth: Payer: Self-pay | Admitting: Family Medicine

## 2017-12-03 NOTE — Telephone Encounter (Signed)
rx called in

## 2017-12-03 NOTE — Telephone Encounter (Signed)
Please call in prescription for Lantus as I have tried to send it several times with no response please let the patient know also.

## 2017-12-03 NOTE — Telephone Encounter (Signed)
Patients Lantus script did not go through yesterday and he is almost out. Can it be resent ASAP.  Thanks

## 2017-12-03 NOTE — Telephone Encounter (Signed)
RX phoned into pharmacy again.

## 2017-12-03 NOTE — Telephone Encounter (Signed)
Pt calling back stating pharmacy hasnt received the insulin and pt is needing it filled he will be out in a couple of days.

## 2017-12-07 ENCOUNTER — Other Ambulatory Visit: Payer: Self-pay | Admitting: Family Medicine

## 2017-12-07 MED ORDER — "INSULIN SYRINGE 28G X 1/2"" 0.5 ML MISC": 1.0000 "application " | Freq: Three times a day (TID) | 12 refills | Status: DC

## 2017-12-07 MED ORDER — INSULIN NPH (HUMAN) (ISOPHANE) 100 UNIT/ML ~~LOC~~ SUSP: 10.0000 [IU] | Freq: Two times a day (BID) | SUBCUTANEOUS | 11 refills | Status: DC

## 2018-01-07 ENCOUNTER — Encounter: Payer: Self-pay | Admitting: Family Medicine

## 2018-01-12 ENCOUNTER — Encounter: Payer: Self-pay | Admitting: Family Medicine

## 2018-01-25 ENCOUNTER — Other Ambulatory Visit: Payer: Self-pay | Admitting: Family Medicine

## 2018-01-26 NOTE — Telephone Encounter (Signed)
Routing back to provider

## 2018-01-26 NOTE — Telephone Encounter (Signed)
Called pt and left message to make appt.  Toujeo request LOV: 07/06/17  Toujeo was noted in the after visit summary for this visit LR: N/A Samples may have been given to pt  PCP: Dr Jeananne Rama Hgb A1C: 8.4 (07/06/17)

## 2018-01-26 NOTE — Telephone Encounter (Signed)
Needs an appointment, when booked, I'll get him enough to make it to his appointment.

## 2018-01-27 NOTE — Telephone Encounter (Signed)
Went straight to VM. LM and asked pt to call back to schedule an appt.

## 2018-11-30 ENCOUNTER — Ambulatory Visit: Payer: Self-pay | Admitting: Nurse Practitioner

## 2018-12-02 ENCOUNTER — Telehealth: Payer: Self-pay | Admitting: Nurse Practitioner

## 2018-12-02 NOTE — Telephone Encounter (Signed)
error 

## 2018-12-03 ENCOUNTER — Encounter: Payer: Self-pay | Admitting: Nurse Practitioner

## 2018-12-03 ENCOUNTER — Ambulatory Visit (INDEPENDENT_AMBULATORY_CARE_PROVIDER_SITE_OTHER): Payer: PRIVATE HEALTH INSURANCE | Admitting: Nurse Practitioner

## 2018-12-03 VITALS — BP 127/79 | HR 67 | Temp 98.0°F | Ht 72.0 in | Wt 168.0 lb

## 2018-12-03 DIAGNOSIS — K900 Celiac disease: Secondary | ICD-10-CM

## 2018-12-03 DIAGNOSIS — E10638 Type 1 diabetes mellitus with other oral complications: Secondary | ICD-10-CM

## 2018-12-03 DIAGNOSIS — E7800 Pure hypercholesterolemia, unspecified: Secondary | ICD-10-CM

## 2018-12-03 LAB — BAYER DCA HB A1C WAIVED: HB A1C (BAYER DCA - WAIVED): 8.4 % — ABNORMAL HIGH (ref <7.0)

## 2018-12-03 MED ORDER — "INSULIN SYRINGE 28G X 1/2"" 0.5 ML MISC": 1.0000 "application " | Freq: Three times a day (TID) | 12 refills | Status: DC

## 2018-12-03 MED ORDER — INSULIN LISPRO 100 UNIT/ML ~~LOC~~ SOLN: 20.0000 [IU] | Freq: Three times a day (TID) | SUBCUTANEOUS | 11 refills | Status: DC
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
## 2018-12-03 NOTE — Assessment & Plan Note (Signed)
Chronic, ongoing.  Not currently on medication due to cost.  Refuses lipid panel today.

## 2018-12-03 NOTE — Patient Instructions (Signed)
Diabetes Mellitus and Nutrition, Adult  When you have diabetes (diabetes mellitus), it is very important to have healthy eating habits because your blood sugar (glucose) levels are greatly affected by what you eat and drink. Eating healthy foods in the appropriate amounts, at about the same times every day, can help you:  · Control your blood glucose.  · Lower your risk of heart disease.  · Improve your blood pressure.  · Reach or maintain a healthy weight.  Every person with diabetes is different, and each person has different needs for a meal plan. Your health care provider may recommend that you work with a diet and nutrition specialist (dietitian) to make a meal plan that is best for you. Your meal plan may vary depending on factors such as:  · The calories you need.  · The medicines you take.  · Your weight.  · Your blood glucose, blood pressure, and cholesterol levels.  · Your activity level.  · Other health conditions you have, such as heart or kidney disease.  How do carbohydrates affect me?  Carbohydrates, also called carbs, affect your blood glucose level more than any other type of food. Eating carbs naturally raises the amount of glucose in your blood. Carb counting is a method for keeping track of how many carbs you eat. Counting carbs is important to keep your blood glucose at a healthy level, especially if you use insulin or take certain oral diabetes medicines.  It is important to know how many carbs you can safely have in each meal. This is different for every person. Your dietitian can help you calculate how many carbs you should have at each meal and for each snack.  Foods that contain carbs include:  · Bread, cereal, rice, pasta, and crackers.  · Potatoes and corn.  · Peas, beans, and lentils.  · Milk and yogurt.  · Fruit and juice.  · Desserts, such as cakes, cookies, ice cream, and candy.  How does alcohol affect me?  Alcohol can cause a sudden decrease in blood glucose (hypoglycemia),  especially if you use insulin or take certain oral diabetes medicines. Hypoglycemia can be a life-threatening condition. Symptoms of hypoglycemia (sleepiness, dizziness, and confusion) are similar to symptoms of having too much alcohol.  If your health care provider says that alcohol is safe for you, follow these guidelines:  · Limit alcohol intake to no more than 1 drink per day for nonpregnant women and 2 drinks per day for men. One drink equals 12 oz of beer, 5 oz of wine, or 1½ oz of hard liquor.  · Do not drink on an empty stomach.  · Keep yourself hydrated with water, diet soda, or unsweetened iced tea.  · Keep in mind that regular soda, juice, and other mixers may contain a lot of sugar and must be counted as carbs.  What are tips for following this plan?    Reading food labels  · Start by checking the serving size on the "Nutrition Facts" label of packaged foods and drinks. The amount of calories, carbs, fats, and other nutrients listed on the label is based on one serving of the item. Many items contain more than one serving per package.  · Check the total grams (g) of carbs in one serving. You can calculate the number of servings of carbs in one serving by dividing the total carbs by 15. For example, if a food has 30 g of total carbs, it would be equal to 2   servings of carbs.  · Check the number of grams (g) of saturated and trans fats in one serving. Choose foods that have low or no amount of these fats.  · Check the number of milligrams (mg) of salt (sodium) in one serving. Most people should limit total sodium intake to less than 2,300 mg per day.  · Always check the nutrition information of foods labeled as "low-fat" or "nonfat". These foods may be higher in added sugar or refined carbs and should be avoided.  · Talk to your dietitian to identify your daily goals for nutrients listed on the label.  Shopping  · Avoid buying canned, premade, or processed foods. These foods tend to be high in fat, sodium,  and added sugar.  · Shop around the outside edge of the grocery store. This includes fresh fruits and vegetables, bulk grains, fresh meats, and fresh dairy.  Cooking  · Use low-heat cooking methods, such as baking, instead of high-heat cooking methods like deep frying.  · Cook using healthy oils, such as olive, canola, or sunflower oil.  · Avoid cooking with butter, cream, or high-fat meats.  Meal planning  · Eat meals and snacks regularly, preferably at the same times every day. Avoid going long periods of time without eating.  · Eat foods high in fiber, such as fresh fruits, vegetables, beans, and whole grains. Talk to your dietitian about how many servings of carbs you can eat at each meal.  · Eat 4-6 ounces (oz) of lean protein each day, such as lean meat, chicken, fish, eggs, or tofu. One oz of lean protein is equal to:  ? 1 oz of meat, chicken, or fish.  ? 1 egg.  ? ¼ cup of tofu.  · Eat some foods each day that contain healthy fats, such as avocado, nuts, seeds, and fish.  Lifestyle  · Check your blood glucose regularly.  · Exercise regularly as told by your health care provider. This may include:  ? 150 minutes of moderate-intensity or vigorous-intensity exercise each week. This could be brisk walking, biking, or water aerobics.  ? Stretching and doing strength exercises, such as yoga or weightlifting, at least 2 times a week.  · Take medicines as told by your health care provider.  · Do not use any products that contain nicotine or tobacco, such as cigarettes and e-cigarettes. If you need help quitting, ask your health care provider.  · Work with a counselor or diabetes educator to identify strategies to manage stress and any emotional and social challenges.  Questions to ask a health care provider  · Do I need to meet with a diabetes educator?  · Do I need to meet with a dietitian?  · What number can I call if I have questions?  · When are the best times to check my blood glucose?  Where to find more  information:  · American Diabetes Association: diabetes.org  · Academy of Nutrition and Dietetics: www.eatright.org  · National Institute of Diabetes and Digestive and Kidney Diseases (NIH): www.niddk.nih.gov  Summary  · A healthy meal plan will help you control your blood glucose and maintain a healthy lifestyle.  · Working with a diet and nutrition specialist (dietitian) can help you make a meal plan that is best for you.  · Keep in mind that carbohydrates (carbs) and alcohol have immediate effects on your blood glucose levels. It is important to count carbs and to use alcohol carefully.  This information is not intended to   replace advice given to you by your health care provider. Make sure you discuss any questions you have with your health care provider.  Document Released: 08/07/2005 Document Revised: 06/10/2017 Document Reviewed: 12/15/2016  Elsevier Interactive Patient Education © 2019 Elsevier Inc.

## 2018-12-03 NOTE — Assessment & Plan Note (Signed)
Chronic, ongoing with A1C 8.4% today.  Currently using Novolin N and wishes to restart Humalog (not Novolog d/t cost).  Has extensive understanding of insulin and regimen.  Was previously on sliding scale.  Does not want long acting at this time d/t cost. Was provided sample for Humalog and script.  Paperwork was provided for assistance programs to assist in obtaining insulin, as pt currently out of work and without insurance.  Discussed importance of maintaining insulin regimen, as life-threatening circumstances could present if he runs out of insulin.

## 2018-12-03 NOTE — Assessment & Plan Note (Signed)
Chronic, stable per patient report as long as he focuses on diet.  Refuses labs today.

## 2019-01-12 ENCOUNTER — Telehealth: Payer: Self-pay

## 2019-01-12 DIAGNOSIS — Z599 Problem related to housing and economic circumstances, unspecified: Secondary | ICD-10-CM

## 2019-01-12 DIAGNOSIS — Z598 Other problems related to housing and economic circumstances: Secondary | ICD-10-CM

## 2019-01-12 NOTE — Telephone Encounter (Signed)
Dustin Ray spoke with patient in regards to financial concerns. Requested a referral to be sent to connected care for financial assistance, medication assistance- diabetic medications, and food for his celiac disease.   Patient has no income, he cant work due to hand injury. Doesn't qualify for medicaid because he has land. Currently waiting on disability to be approved.   Referral sent to connected care. Patient might be eligible for chronic care management when they start at [facility]

## 2019-02-01 ENCOUNTER — Ambulatory Visit: Payer: Self-pay | Admitting: *Deleted

## 2019-02-01 ENCOUNTER — Telehealth: Payer: Self-pay | Admitting: Family Medicine

## 2019-02-01 DIAGNOSIS — E7800 Pure hypercholesterolemia, unspecified: Secondary | ICD-10-CM

## 2019-02-01 DIAGNOSIS — E10638 Type 1 diabetes mellitus with other oral complications: Secondary | ICD-10-CM

## 2019-02-01 DIAGNOSIS — K900 Celiac disease: Secondary | ICD-10-CM

## 2019-02-01 NOTE — Chronic Care Management (AMB) (Signed)
  Care Management Note   Dustin Ray is a 48 y.o. year old male who is a primary care patient of Crissman, Jeannette How, MD. The CM team was consult for assistance with chronic disease management and care coordination.   I reached out to Sherrilyn Rist by phone today bur was unable to reach him. I left a message requesting a return call so that I can introduce CM services and the assistance we may be able to provide Mr. Bollen with disease management and medication management and assistance.   Review of patient status, including review of consultants reports, relevant laboratory and other test results, and collaboration with appropriate care team members and the patient's provider was performed as part of comprehensive patient evaluation and provision of chronic care management services.   Follow Up Plan: The CM team will reach out to the patient again over the next 3 days.    Janalyn Shy MHA,BSN,RN,CCM Nurse Care Coordinator [facility] Care Management [phone]

## 2019-02-01 NOTE — Telephone Encounter (Signed)
° ° °  Called Pt regarding Community Resource Referral for financial assistance: see detailed notes in Referral.  [place]  ??[email or web address]  ?? [phone]   Skype

## 2019-02-01 NOTE — Addendum Note (Signed)
Addended by: Clerance Lav on: 02/01/2019 03:03 PM   Modules accepted: Orders

## 2019-02-02 ENCOUNTER — Telehealth: Payer: Self-pay

## 2019-02-02 NOTE — Telephone Encounter (Signed)
Lm to follow up on [facility] assistance approval. lmtcb [id number]

## 2019-02-03 ENCOUNTER — Telehealth: Payer: Self-pay

## 2019-02-03 NOTE — Telephone Encounter (Signed)
Lm 3/12 regarding Dustin Ray Referral knb

## 2019-02-04 ENCOUNTER — Telehealth: Payer: Self-pay

## 2019-02-04 NOTE — Telephone Encounter (Signed)
Pt returned call   [facility] 3/13 at 8:45am knb

## 2019-02-04 NOTE — Telephone Encounter (Signed)
Dustin Ray, I spoke with [name] regarding his mortgage statement, he will be bringing it to [employer] this morning and would like for [place] to make a copy of it. Could you please scan and email to [email or web address] in order to make payment towards his mortgage loan through the [facility]?  Thanks so much! Anne Shutter [phone]

## 2019-02-04 NOTE — Telephone Encounter (Signed)
Phone call with Laural Roes to confirm

## 2019-02-04 NOTE — Telephone Encounter (Signed)
Pt brought in paperwork, I emailed through fax

## 2019-02-07 NOTE — Telephone Encounter (Signed)
LMTCB gave update regarding accounts payable expected to be cut and sent this week but will confirm with [facility] the date and check # once available.

## 2019-02-08 ENCOUNTER — Ambulatory Visit: Payer: Self-pay | Admitting: *Deleted

## 2019-02-08 ENCOUNTER — Telehealth: Payer: Self-pay

## 2019-02-08 NOTE — Chronic Care Management (AMB) (Signed)
  Chronic Care Management   Note  02/08/2019 Name: Dustin Ray MRN: [id number] DOB: August 06, 1971  Unsuccessful telephone outreach. Left HIPAA compliant voice message requesting return call.  Follow up plan: The CM team will reach out to the patient again over the next 2 days.   Merlene Morse Edwen Mclester RN, BSN Nurse Case Editor, [facility] Care Management  [phone]) Business Mobile\

## 2019-02-09 ENCOUNTER — Other Ambulatory Visit: Payer: Self-pay

## 2019-02-09 ENCOUNTER — Ambulatory Visit: Payer: Self-pay | Admitting: *Deleted

## 2019-02-09 ENCOUNTER — Ambulatory Visit: Payer: Self-pay | Admitting: Pharmacist

## 2019-02-09 DIAGNOSIS — Z598 Other problems related to housing and economic circumstances: Secondary | ICD-10-CM

## 2019-02-09 DIAGNOSIS — E10638 Type 1 diabetes mellitus with other oral complications: Secondary | ICD-10-CM

## 2019-02-09 DIAGNOSIS — K900 Celiac disease: Secondary | ICD-10-CM

## 2019-02-09 DIAGNOSIS — Z599 Problem related to housing and economic circumstances, unspecified: Secondary | ICD-10-CM

## 2019-02-09 NOTE — Chronic Care Management (AMB) (Signed)
Chronic Care Management   Initial Visit Note  02/09/2019 Name: Dustin Ray MRN: [id number] DOB: 1971-03-14  Referred by: Dustin Maple, MD Reason for referral : Chronic Care Management (Telephone follow up)   Dustin Ray is a 48 y.o. year old male who is a primary care patient of Crissman, Dustin How, MD. The CCM team was consulted for assistance with chronic disease management and care coordination needs.   Review of patient status, including review of consultants reports, relevant laboratory and other test results, and collaboration with appropriate care team members and the patient's provider was performed as part of comprehensive patient evaluation and provision of chronic care management services.    SDOH (Social Determinants of Health) screening performed today. See Care Plan Entry related to challenges with: Financial Strain especially related to medications.   Objective:   Goals Addressed     "I have had lows because I changed  insulins. Need help with managing my sugars."  (pt-stated)       Current Barriers:   Knowledge Deficits related to medications used for management of diabetes related to new insulin.   Difficulty obtaining or cannot afford medications. Changed to Novolin N and Novolin R for cost reasons.  Financial Constraints- Patient going through the process to obtain disability.   Case Manager Clinical Goal(s):  Over the next 30 days, patient will demonstrate improved adherence to prescribed treatment plan for diabetes self care/management as evidenced by:   daily monitoring and recording of CBG    Will collaborate with CCM Pharmacist about medications  Will collaborate with CCM LCSW concerning financial constraints.   Interventions:   Reviewed medications with patient and discussed importance of medication adherence  Discussed plans with patient for ongoing care management follow up and provided patient with direct contact information for care  management team   Requested patient call back this afternoon to obtain meter information and CBG results.  Patient Self Care Activities:   Self administers insulin as prescribed but adjusts regular insulin with out checking CBG and is having lows.  Initial goal documentation      "I need to be able to eat celiac diet cheaper" (pt-stated)       Current Barriers:   Film/video editor.   Nurse Case Manager Clinical Goal(s):   Over the next 30 days, patient will work with CCM RN to address needs related to being able to adhere to a Celiac Diet own a budget.  Interventions:   Advised patient to on cheaper options for Celiac Diet.  Patient Self Care Activities:   Performs ADL's independently  Performs IADL's independently  Initial goal documentation       Dustin Ray was given information about Chronic Care Management services today including:  1. CCM service includes personalized support from designated clinical staff supervised by his physician, including individualized plan of care and coordination with other care providers 2. 24/7 contact phone numbers for assistance for urgent and routine care needs. 3. Service will only be billed when office clinical staff spend 20 minutes or more in a month to coordinate care. 4. Only one practitioner may furnish and bill the service in a calendar month. 5. The patient may stop CCM services at any time (effective at the end of the month) by phone call to the office staff. 6. The patient will be responsible for cost sharing (co-pay) of up to 20% of the service fee (after annual deductible is met).  Patient agreed to services and verbal consent  obtained.   The patient will call CCM Team this afternoon* as advised to discuss CBG's and further low cost Celiac Diet options.Merlene Morse Renita Brocks RN, BSN Nurse Case Manager [facility] Care Management  [phone]) Business Mobile

## 2019-02-09 NOTE — Patient Instructions (Signed)
Visit Information  Goals Addressed            This Visit's Progress     Patient Stated   . "I have had lows because I changed  insulins. Need help with managing my sugars."  (pt-stated)       Current Barriers:  . Difficulty obtaining or cannot afford medications- Previously managed on Toujeo + Humalog; lost insurance and has switched to Novolin N + R; reports AM hypoglycemia, frequently waking up with blood sugars <50  Pharmacist Clinical Goal(s):  Marland Kitchen Over the next 30 days, patient will collaborate with PharmD on process for obtaining medications through [facility]  Interventions:  . PharmD will collaborate with PCP Dr. Jeananne Rama to send Toujeo and Humalog prescriptions to [facility]. Patient's preferred pharmacy has been updated in electronic medical record.  . Patient will [facility] on Friday and pick up application; will complete application and provide necessary financial documentation to [facility] . Counseled patient to eat a snack before bed to prevent bedtime Novolin N from causing AM hypoglycemia. Counseled on treatment of hypoglycemia  Patient Self Care Activities:  . Patient to start checking fasting and HS blood sugar . Patient will update CM team if difficulties establishing care with [facility]  Please see past updates related to this goal by clicking on the "Past Updates" button in the selected goal         The patient verbalized understanding of instructions provided today and declined a print copy of patient instruction materials.   Plan: - PharmD will collaborate with PCP office staff to have Toujeo, Humalog, and Livalo prescriptions sent to [facility].   Catie Darnelle Maffucci, PharmD Clinical Pharmacist [place] [phone]

## 2019-02-09 NOTE — Patient Instructions (Signed)
Visit Information  Goals Addressed            This Visit's Progress   . "I have had lows because I changed  insulins. Need help with managing my sugars."  (pt-stated)       Current Barriers:  Marland Kitchen Knowledge Deficits related to medications used for management of diabetes related to new insulin.  . Difficulty obtaining or cannot afford medications. Changed to Novolin N and Novolin R for cost reasons. . Financial Constraints- Patient going through the process to obtain disability.   Case Manager Clinical Goal(s):  Over the next 30 days, patient will demonstrate improved adherence to prescribed treatment plan for diabetes self care/management as evidenced by:  . daily monitoring and recording of CBG   . Will collaborate with CCM Pharmacist about medications . Will collaborate with CCM LCSW concerning financial constraints.   Interventions:  . Reviewed medications with patient and discussed importance of medication adherence . Discussed plans with patient for ongoing care management follow up and provided patient with direct contact information for care management team  . Requested patient call back this afternoon to obtain meter information and CBG results.  Patient Self Care Activities:  . Self administers insulin as prescribed but adjusts regular insulin with out checking CBG and is having lows.  Initial goal documentation     . "I need to be able to eat celiac diet cheaper" (pt-stated)       Current Barriers:  . Film/video editor.   Nurse Case Manager Clinical Goal(s):  Marland Kitchen Over the next 30 days, patient will work with CCM RN to address needs related to being able to adhere to a Celiac Diet own a budget.  Interventions:  . Advised patient to on cheaper options for Celiac Diet.  Patient Self Care Activities:  . Performs ADL's independently . Performs IADL's independently  Initial goal documentation        Mr. Schetter was given information about Chronic Care Management  services today including:  1. CCM service includes personalized support from designated clinical staff supervised by his physician, including individualized plan of care and coordination with other care providers 2. 24/7 contact phone numbers for assistance for urgent and routine care needs. 3. Service will only be billed when office clinical staff spend 20 minutes or more in a month to coordinate care. 4. Only one practitioner may furnish and bill the service in a calendar month. 5. The patient may stop CCM services at any time (effective at the end of the month) by phone call to the office staff. 6. The patient will be responsible for cost sharing (co-pay) of up to 20% of the service fee (after annual deductible is met).  Patient agreed to services and verbal consent obtained.   The patient verbalized understanding of instructions provided today and declined a print copy of patient instruction materials.   Telephone follow up appointment with CCM team member scheduled PQZ:RAQTM at [place] Glorya Bartley RN, BSN Nurse Case Manager [facility] Care Management  [phone]) Business Mobile

## 2019-02-09 NOTE — Chronic Care Management (AMB) (Signed)
  Chronic Care Management   Follow Up Note   02/09/2019 Name: Dustin Ray MRN: [id number] DOB: 08-27-1971  Referred by: [name], MD Reason for referral : Chronic Care Management (Medication Management)   Dustin Ray is a 48 y.o. year old male who is a primary care patient of Crissman, Jeannette How, MD. The CCM team was consulted for assistance with chronic disease management and care coordination needs.    Contacted patient telephonically today with CCM RN.  Review of patient status, including review of consultants reports, relevant laboratory and other test results, and collaboration with appropriate care team members and the patient's provider was performed as part of comprehensive patient evaluation and provision of chronic care management services.    Goals Addressed            This Visit's Progress     Patient Stated   . "I have had lows because I changed  insulins. Need help with managing my sugars."  (pt-stated)       Current Barriers:  . Difficulty obtaining or cannot afford medications- Previously managed on Toujeo + Humalog; lost insurance and has switched to Novolin N + R; reports AM hypoglycemia, frequently waking up with blood sugars <50  Pharmacist Clinical Goal(s):  Marland Kitchen Over the next 30 days, patient will collaborate with PharmD on process for obtaining medications through [facility]  Interventions:  . PharmD will collaborate with PCP Dr. Jeananne Rama to send Toujeo and Humalog prescriptions to [facility]. Patient's preferred pharmacy has been updated in electronic medical record.  . Patient will visit [facility] on Friday and pick up application; will complete application and provide necessary financial documentation to [facility] . Counseled patient to eat a snack before bed to prevent bedtime Novolin N from causing AM hypoglycemia. Counseled on treatment of hypoglycemia  Patient Self Care  Activities:  . Patient to start checking fasting and HS blood sugar . Patient will update CM team if difficulties establishing care with [facility]  Please see past updates related to this goal by clicking on the "Past Updates" button in the selected goal          Plan: - PharmD will collaborate with PCP office staff to have Toujeo, Humalog, and Livalo prescriptions sent to [facility].   Catie Darnelle Maffucci, PharmD Clinical Pharmacist [place] [phone]

## 2019-02-10 ENCOUNTER — Other Ambulatory Visit: Payer: Self-pay | Admitting: Nurse Practitioner

## 2019-02-10 ENCOUNTER — Telehealth: Payer: Self-pay | Admitting: Family Medicine

## 2019-02-10 MED ORDER — PITAVASTATIN CALCIUM 1 MG PO TABS: 1.0000 mg | ORAL_TABLET | Freq: Every day | ORAL | 12 refills | Status: DC

## 2019-02-10 MED ORDER — INSULIN LISPRO 100 UNIT/ML ~~LOC~~ SOLN: 20.0000 [IU] | Freq: Three times a day (TID) | SUBCUTANEOUS | 11 refills | Status: DC

## 2019-02-10 MED ORDER — INSULIN GLARGINE (1 UNIT DIAL) 300 UNIT/ML ~~LOC~~ SOPN: 36.0000 [IU] | PEN_INJECTOR | Freq: Every day | SUBCUTANEOUS | 4 refills | Status: DC

## 2019-02-10 NOTE — Progress Notes (Signed)
Per pharmacist Nelva Nay, Humalog, and Livalo scripts sent to medical management.

## 2019-02-10 NOTE — Telephone Encounter (Signed)
Copied from [place] [id number]. Topic: Quick Communication - Rx Refill/Question >> Feb 10, 2019 12:21 PM Rayann Heman wrote: Medication:Pitavastatin Calcium (LIVALO) 1 MG TABS [[id number]]  pharmacy called and stated that they do not have RX and would like to know if something else should be sent in. Please advise. Will get some in stock in 6 weeks.

## 2019-02-11 ENCOUNTER — Other Ambulatory Visit: Payer: Self-pay

## 2019-02-11 ENCOUNTER — Telehealth: Payer: Self-pay | Admitting: Family Medicine

## 2019-02-11 ENCOUNTER — Ambulatory Visit: Payer: Self-pay | Admitting: Pharmacy Technician

## 2019-02-11 ENCOUNTER — Other Ambulatory Visit: Payer: Self-pay | Admitting: Family Medicine

## 2019-02-11 DIAGNOSIS — Z79899 Other long term (current) drug therapy: Secondary | ICD-10-CM

## 2019-02-11 MED ORDER — ATORVASTATIN CALCIUM 10 MG PO TABS: ORAL_TABLET | ORAL | 3 refills | Status: DC

## 2019-02-11 NOTE — Progress Notes (Signed)
Completed [facility] application and contract.  Patient agreed to all terms of the [facility] contract.    Provided patient with community resource material based on her particular needs.    Patient's household income is around 275% of FPL.  Unable to provide medication assistance with generic medications.  However, ordering Novolog and Toujeo because [employer] and [employer] have income limits of 400% FPL for insulin products.   Unable to order Livalo because patient's household income exceeds [place]'s eligibility criteria.  Patient to contact Dr. Jeananne Rama about obtaining an alternate cholesterol medication that he can afford to purchase.   Patient indicated that he had been using Novolog for around 12 years.  However, when he lost his insurance, he was switched to Humalog.  Patient requested that I complete PAP Application for Novolog instead of Humalog.  Novolog and Toujeo PAP Applications sent to Dr. Jeananne Rama for signature.  Upon receipt of signed applications from provider, Novolog will be submitted to [place] will be submitted to [employer].   [facility]

## 2019-02-11 NOTE — Telephone Encounter (Signed)
Spoke with patient to provide check # and information from [employer] regarding mortgage assistance.    Pt stated that he was not able to connect with [facility] today via phone # at [phone]. I told them they could be at lunch and that I would have start an application and he could pick it up at [facility] this afternoon will send to [place] to print and give to pt.  Thanks, Jill Alexanders  [phone]

## 2019-02-28 ENCOUNTER — Telehealth: Payer: Self-pay | Admitting: Pharmacist

## 2019-02-28 NOTE — Telephone Encounter (Signed)
02/28/2019 2:33:52 PM - Novolog Flexpen & tips  02/28/2019 Faxed [employer] application for [employer] Inject 20 units under the skin 3 times a day with meals. Patient to adjust insulin with meals Max daily dose 60 units, #5 boxes & tips.AJ   02/28/2019 2:32:18 PM - [name]  02/28/2019 Faxed [employer] application for [employer] Inject 36 units daily at bedtime, #3 boxes.Delos Haring

## 2019-03-01 ENCOUNTER — Telehealth: Payer: Self-pay

## 2019-03-01 ENCOUNTER — Ambulatory Visit: Payer: Self-pay | Admitting: Pharmacist

## 2019-03-01 DIAGNOSIS — E10638 Type 1 diabetes mellitus with other oral complications: Secondary | ICD-10-CM

## 2019-03-01 NOTE — Chronic Care Management (AMB) (Signed)
  Chronic Care Management   Note  03/01/2019 Name: Dustin Ray MRN: [id number] DOB: 04/27/1971  Patient is a 48 year old male followed by Golden Pop, MD for primary care services, referred to chronic care management for support with diabetes, celiac disease.   Contacted patient to follow up on obtaining medications from [facility]. Left HIPAA compliant message for patient to return my call.   Follow up plan: - Will attempt another outreach in 2-3 business days  Catie Darnelle Maffucci, PharmD Clinical Pharmacist [place] [phone]

## 2019-03-02 ENCOUNTER — Telehealth: Payer: Self-pay | Admitting: Pharmacist

## 2019-03-02 ENCOUNTER — Ambulatory Visit: Payer: Self-pay | Admitting: Pharmacist

## 2019-03-02 DIAGNOSIS — E10638 Type 1 diabetes mellitus with other oral complications: Secondary | ICD-10-CM

## 2019-03-02 NOTE — Telephone Encounter (Signed)
03/02/2019 8:43:57 AM - [name] approval  03/02/2019 Received approval letter from [employer] for [name], approved till 02/28/2020.Delos Haring

## 2019-03-02 NOTE — Chronic Care Management (AMB) (Signed)
  Chronic Care Management   Follow Up Note   03/02/2019 Name: Dustin Ray MRN: [id number] DOB: 07-27-1971  Referred by: Dustin Maple, MD Reason for referral : Chronic Care Management (Medication Management)   Dustin Ray is a 48 y.o. year old male who is a primary care patient of Dustin, Jeannette How, MD. The CCM team was consulted for assistance with chronic disease management and care coordination needs.    Review of patient status, including review of consultants reports, relevant laboratory and other test results, and collaboration with appropriate care team members and the patient's provider was performed as part of comprehensive patient evaluation and provision of chronic care management services.    Goals Addressed            This Visit's Progress     Patient Stated   . "I have had lows because I changed  insulins. Need help with managing my sugars."  (pt-stated)       Current Barriers:  . Patient lost insurance coverage; has since been [facility] with [facility], and is now receiving Toujeo and Novolog.  o Per chart review, approved for Toujeo through 02/28/2020. Novolog approval pending.  Pharmacist Clinical Goal(s):  Marland Kitchen Over the next 30 days, patient will collaborate with PharmD on process for obtaining medications through [facility]  Interventions:  . Contacted patient to discuss if he had received medications through [facility] and if he was taking them. He returned my call after hours and left a message stating that he is taking Toujeo and Novolog and his sugars are "much better", with less episodes of hypoglycemia than when on Novolin N/R  Patient Self Care Activities:  . Patient will continue to check blood sugar BID-TID . Patient will continue to work with [facility] to receive medications  Please see past updates related to this goal by clicking on the "Past Updates" button in the selected goal          Plan:   - PharmD will follow up with patient in 2 weeks to discuss blood sugar results, change from pitavastatin to atorvastatin  Dustin Ray [phone]

## 2019-03-02 NOTE — Patient Instructions (Signed)
Visit Information  Goals Addressed            This Visit's Progress     Patient Stated   . "I have had lows because I changed  insulins. Need help with managing my sugars."  (pt-stated)       Current Barriers:  . Patient lost insurance coverage; has since been [facility] with [facility], and is now receiving Toujeo and Novolog.  o Per chart review, approved for Toujeo through 02/28/2020. Novolog approval pending.  Pharmacist Clinical Goal(s):  Marland Kitchen Over the next 30 days, patient will collaborate with PharmD on process for obtaining medications through [facility]  Interventions:  . Contacted patient to discuss if he had received medications through [facility] and if he was taking them. He returned my call after hours and left a message stating that he is taking Toujeo and Novolog and his sugars are "much better", with less episodes of hypoglycemia than when on Novolin N/R  Patient Self Care Activities:  . Patient will continue to check blood sugar BID-TID . Patient will continue to work with [facility] to receive medications  Please see past updates related to this goal by clicking on the "Past Updates" button in the selected goal         The patient verbalized understanding of instructions provided today and declined a print copy of patient instruction materials.   Plan:  - PharmD will follow up with patient in 2 weeks to discuss blood sugar results, change from pitavastatin to atorvastatin  Courtney Heys, PharmD Clinical Pharmacist [place] [phone]

## 2019-03-04 ENCOUNTER — Telehealth: Payer: Self-pay

## 2019-03-10 ENCOUNTER — Ambulatory Visit: Payer: Self-pay | Admitting: *Deleted

## 2019-03-10 DIAGNOSIS — E10638 Type 1 diabetes mellitus with other oral complications: Secondary | ICD-10-CM

## 2019-03-10 DIAGNOSIS — K900 Celiac disease: Secondary | ICD-10-CM

## 2019-03-10 DIAGNOSIS — Z598 Other problems related to housing and economic circumstances: Secondary | ICD-10-CM

## 2019-03-10 DIAGNOSIS — Z599 Problem related to housing and economic circumstances, unspecified: Secondary | ICD-10-CM

## 2019-03-10 NOTE — Patient Instructions (Signed)
Thank you allowing the Chronic Care Management Team to be a part of your care! It was a pleasure speaking with you today!  1. Continue to follow prescribed medication regimen.  2. Check out this website for budget friendly gluten free recipes: https://iowagirleats.com/one-pan-recipes/  CCM (Chronic Care Management) Team   Suvan Stcyr RN, BSN Nurse Care Coordinator  [phone]  Catie [facility] PharmD  Clinical Pharmacist  [phone]  Eula Fried LCSW Clinical Social Worker [phone]  Goals Addressed            This Visit's Progress   . "I need to be able to eat celiac diet cheaper" (pt-stated)       Current Barriers:  . Film/video editor.   Nurse Case Manager Clinical Goal(s):  Marland Kitchen Over the next 30 days, patient will work with CCM RN to address needs related to being able to adhere to a Celiac Diet own a budget.  Interventions:  . Advised patient to on cheaper options for Celiac Diet. . Provided education to patient re: Book sent to patient's home"Gluten Free- On a Budget" also provided website https://david-yates.net/ . Social Work referral for low support   Patient Self Care Activities:  . Performs ADL's independently . Performs IADL's independently  Please see past updates related to this goal by clicking on the "Past Updates" button in the selected goal         The patient verbalized understanding of instructions provided today and declined a print copy of patient instruction materials.   The CM team will reach out to the patient again over the next 14 days.

## 2019-03-10 NOTE — Chronic Care Management (AMB) (Signed)
  Chronic Care Management   Follow Up Note   03/10/2019 Name: Dustin Ray MRN: [id number] DOB: 01/17/1971  Referred by: [name], MD Reason for referral : Chronic Care Management (F/U on diabetes)   Dustin Ray is a 48 y.o. year old male who is a primary care patient of Crissman, Jeannette How, MD. The CCM team was consulted for assistance with chronic disease management and care coordination needs.    Spoke with Dustin Ray on the phone today. He reports his sugars being under much better control now since he was able to receive his medications through [facility]. Reported no new lows, checking CBG qd.  Patient continues to be concerned about financial issues. He stated he also had no friends or family to be a support for him. I requested Dustin Ray CCM-LCSW reach out to the patient for financial concerns. Will continue to build a rapport with patient ad provide support.   Review of patient status, including review of consultants reports, relevant laboratory and other test results, and collaboration with appropriate care team members and the patient's provider was performed as part of comprehensive patient evaluation and provision of chronic care management services.    Goals Addressed            This Visit's Progress   . "I need to be able to eat celiac diet cheaper" (pt-stated)       Current Barriers:  . Film/video editor.   Nurse Case Manager Clinical Goal(s):  Marland Kitchen Over the next 30 days, patient will work with CCM RN to address needs related to being able to adhere to a Celiac Diet own a budget.  Interventions:  . Advised patient to on cheaper options for Celiac Diet. . Provided education to patient re: Book sent to patient's home"Gluten Free- On a Budget" also provided website https://david-yates.net/ . Social Work referral for low support   Patient Self Care Activities:  . Performs ADL's independently  . Performs IADL's independently  Please see past updates related to this goal by clicking on the "Past Updates" button in the selected goal          The CM team will reach out to the patient again over the next 14 days.  The patient has been provided with contact information for the chronic care management team and has been advised to call with any health related questions or concerns.     Dustin Ray Dustin Olson RN, BSN Nurse Case Editor, [facility]  [phone]) Business Mobile

## 2019-03-15 ENCOUNTER — Ambulatory Visit: Payer: Self-pay | Admitting: Pharmacist

## 2019-03-15 ENCOUNTER — Telehealth: Payer: Self-pay

## 2019-03-15 DIAGNOSIS — E10638 Type 1 diabetes mellitus with other oral complications: Secondary | ICD-10-CM

## 2019-03-15 NOTE — Chronic Care Management (AMB) (Signed)
  Chronic Care Management   Note  03/15/2019 Name: Dustin Ray MRN: [id number] DOB: [birth year]  Patient is a 48 year old male followed by Golden Pop, MD for primary care services, referred to chronic care management for support with diabetes, celiac disease.   Contacted patient to follow up blood glucose results since transitioning to Toujeo/Novolog. Left HIPAA compliant message for patient to return my call at his convenience.   Follow up plan: - If I have not heard back, will follow up in 2-3 business days  Catie Darnelle Maffucci, PharmD Clinical Pharmacist [place] [phone]

## 2019-03-16 ENCOUNTER — Telehealth: Payer: Self-pay | Admitting: Pharmacist

## 2019-03-16 NOTE — Telephone Encounter (Signed)
03/16/2019 11:44:58 AM - NovoFine 32G tips  03/16/2019 Had received a message to call [employer] on this patient, called spoke with Tracy--according to her the incorrect needle was submitted-the application was correct but the script was for Novotwist. She faxed me a Refill form that I will need to submit for needles-we have already received the Flexpen. I have mailed today to Dr. Jeananne Rama to sign correction and then I will fax to [employer] for processing.Dustin Ray

## 2019-03-18 ENCOUNTER — Ambulatory Visit: Payer: Self-pay | Admitting: Pharmacist

## 2019-03-18 DIAGNOSIS — Z599 Problem related to housing and economic circumstances, unspecified: Secondary | ICD-10-CM

## 2019-03-18 DIAGNOSIS — E7800 Pure hypercholesterolemia, unspecified: Secondary | ICD-10-CM

## 2019-03-18 DIAGNOSIS — E10638 Type 1 diabetes mellitus with other oral complications: Secondary | ICD-10-CM

## 2019-03-18 DIAGNOSIS — Z598 Other problems related to housing and economic circumstances: Secondary | ICD-10-CM

## 2019-03-18 NOTE — Chronic Care Management (AMB) (Signed)
Chronic Care Management   Follow Up Note   03/18/2019 Name: Dustin Ray MRN: [id number] DOB: 1971-05-29  Referred by: [name], MD Reason for referral : Chronic Care Management (Medication Management)   Dustin Ray is a 48 y.o. year old male who is a primary care patient of Crissman, Jeannette How, MD. The CCM team was consulted for assistance with chronic disease management and care coordination needs.    Contacted patient telephonically for blood sugar review since changing to Tresiba/Novolog.   Review of patient status, including review of consultants reports, relevant laboratory and other test results, and collaboration with appropriate care team members and the patient's provider was performed as part of comprehensive patient evaluation and provision of chronic care management services.    Goals Addressed            This Visit's Progress     Patient Stated   . "I have had lows because I changed  insulins. Need help with managing my sugars."  (pt-stated)       Current Barriers:  . Patient lost insurance coverage; has since been [facility] with [facility], and is now receiving Toujeo and Novolog.  . Today, notes that he has been checking FBG, and that results have been in the 80s-110s. Denies any episodes of hypoglycemia. Has not been checking sugars any other time of the day  Pharmacist Clinical Goal(s):  Marland Kitchen Over the next 30 days, patient will collaborate with PharmD on optimized medication management  Interventions:  . Counseled patient to check 2 hour post prandial and/or bedtime sugars to evaluate all day blood sugar control. Educated on goal fasting and goal 2 hour post prandial blood sugars for corresponding A1c <7% . Encouraged to contact clinic if hypoglycemia develops  Patient Self Care Activities:  . Patient will start to check blood sugar BID-TID . Patient will continue to collaborate [facility] to receive medications   Please see past updates related to this goal by clicking on the "Past Updates" button in the selected goal      . "I want to make sure I'm on the right medications" (pt-stated)       Current Barriers:  . History of intolerances to statin therapy . 10 year ASCVD risk 5.8%, though, prolonged history of T1DM (per patient report, >20 years) significantly increases risk for clinical ASCVD  o Per chart review, has tried atorvastatin 40 mg daily, lovastatin 40 mg daily, pravastatin 20 mg daily, pitavastatin 1 mg daily o Per [facility], he does not qualify for pitavastatin (Livalo) assistance due to elevated income  o Currently prescribed atorvastatin 40 mg, 1 tablet weekly. He notes that he hasn't picked it up from the pharmacy yet   Pharmacist Clinical Goal(s):  Marland Kitchen Over the next 90 days, patient will work with PharmD and PCP to address needs related to optimal ASCVD risk reduction  Interventions: . Counseled patient on benefits of statin therapy in patients with diabetes, particularly the increased risk of ASCVD in patients with a prolonged duration of T1DM . Consider updated lipid panel (last in 2018) to determine optimal therapy management . Encouraged to try atorvastatin once weekly. If tolerated, would recommend increasing to at least three times weekly, as there is little data supporting the benefit of less than three times weekly atorvastatin dosing.  . If atorvastatin is not tolerated, could consider rosuvastatin, if able to determine affordable way for patient to receive. It does have data supporting twice weekly dosing,  and more hydrophilic nature reduces the incidence of muscle-related side effects.  . Patient may be an appropriate candidate for recently approved non-statin therapy, Nexlizet (bempedoic acid/ezetimibe). Patient assistance programs do not currently exist. Will continue to evaluate assistance options available to the patient. . Moving forward, will encourage  patient to evaluate interest/readiness to pursue tobacco cessation for ASCVD risk reduction  Patient Self Care Activities:  . Calls pharmacy for medication refills . Calls provider office for new concerns or questions  Initial goal documentation         Plan: - PharmD will outreach patient in 3-4 weeks for continued support and medication management  Catie Darnelle Maffucci, PharmD Clinical Pharmacist [place] [phone]

## 2019-03-18 NOTE — Patient Instructions (Signed)
Visit Information  Goals Addressed            This Visit's Progress     Patient Stated   . "I have had lows because I changed  insulins. Need help with managing my sugars."  (pt-stated)       Current Barriers:  . Patient lost insurance coverage; has since been [facility] with [facility], and is now receiving Toujeo and Novolog.  . Today, notes that he has been checking FBG, and that results have been in the 80s-110s. Denies any episodes of hypoglycemia. Has not been checking sugars any other time of the day  Pharmacist Clinical Goal(s):  Marland Kitchen Over the next 30 days, patient will collaborate with PharmD on optimized medication management  Interventions:  . Counseled patient to check 2 hour post prandial and/or bedtime sugars to evaluate all day blood sugar control. Educated on goal fasting and goal 2 hour post prandial blood sugars for corresponding A1c <7% . Encouraged to contact clinic if hypoglycemia develops  Patient Self Care Activities:  . Patient will start to check blood sugar BID-TID . Patient will continue to collaborate [facility] to receive medications  Please see past updates related to this goal by clicking on the "Past Updates" button in the selected goal      . "I want to make sure I'm on the right medications" (pt-stated)       Current Barriers:  . History of intolerances to statin therapy . 10 year ASCVD risk 5.8%, though, prolonged history of T1DM (per patient report, >20 years) significantly increases risk for clinical ASCVD  o Per chart review, has tried atorvastatin 40 mg daily, lovastatin 40 mg daily, pravastatin 20 mg daily, pitavastatin 1 mg daily o Per [facility], he does not qualify for pitavastatin (Livalo) assistance due to elevated income  o Currently prescribed atorvastatin 40 mg, 1 tablet weekly. He notes that he hasn't picked it up from the pharmacy yet   Pharmacist Clinical Goal(s):  Marland Kitchen Over the  next 90 days, patient will work with PharmD and PCP to address needs related to optimal ASCVD risk reduction  Interventions: . Counseled patient on benefits of statin therapy in patients with diabetes, particularly the increased risk of ASCVD in patients with a prolonged duration of T1DM . Consider updated lipid panel (last in 2018) to determine optimal therapy management . Encouraged to try atorvastatin once weekly. If tolerated, would recommend increasing to at least three times weekly, as there is little data supporting the benefit of less than three times weekly atorvastatin dosing.  . If atorvastatin is not tolerated, could consider rosuvastatin, if able to determine affordable way for patient to receive. It does have data supporting twice weekly dosing, and more hydrophilic nature reduces the incidence of muscle-related side effects.  . Patient may be an appropriate candidate for recently approved non-statin therapy, Nexlizet (bempedoic acid/ezetimibe). Patient assistance programs do not currently exist. Will continue to evaluate assistance options available to the patient. . Moving forward, will encourage patient to evaluate interest/readiness to pursue tobacco cessation for ASCVD risk reduction  Patient Self Care Activities:  . Calls pharmacy for medication refills . Calls provider office for new concerns or questions  Initial goal documentation         The patient verbalized understanding of instructions provided today and declined a print copy of patient instruction materials.   Plan: - PharmD will outreach patient in 3-4 weeks for continued support and medication management  Catie  Darnelle Maffucci, PharmD Clinical Pharmacist [place] [phone]

## 2019-03-28 ENCOUNTER — Ambulatory Visit: Payer: Self-pay | Admitting: Licensed Clinical Social Worker

## 2019-03-28 NOTE — Chronic Care Management (AMB) (Signed)
  Chronic Care Management    Clinical Social Work Follow Up Note  03/28/2019 Name: Dustin Ray MRN: [id number] DOB: 1970/12/02  Dustin Ray is a 48 y.o. year old male who is a primary care patient of Crissman, Jeannette How, MD. The CCM team was consulted for assistance with Community ResourcesLCSW completed initial outreach but was unable to reach patient successfully but left a HIPPA compliant voice message encouraging a return call once available.   Review of patient status, including review of consultants reports, other relevant assessments, and collaboration with appropriate care team members and the patient's provider was performed as part of comprehensive patient evaluation and provision of chronic care management services.     Follow Up Plan: SW will follow up with patient by phone over the next 14 days  Eula Fried, [place], MSW, [email or web address] Phone: [phone]

## 2019-03-29 ENCOUNTER — Telehealth: Payer: Self-pay

## 2019-04-05 ENCOUNTER — Telehealth: Payer: Self-pay

## 2019-04-05 ENCOUNTER — Ambulatory Visit: Payer: Self-pay | Admitting: Licensed Clinical Social Worker

## 2019-04-05 NOTE — Chronic Care Management (AMB) (Signed)
  Chronic Care Management    Clinical Social Work Follow Up Note  04/05/2019 Name: Dustin Ray MRN: [id number] DOB: 06/16/1971  Dustin Ray is a 48 y.o. year old male who is a primary care patient of Crissman, Jeannette How, MD. The CCM team was consulted for assistance with [employer].   Review of patient status, including review of consultants reports, other relevant assessments, and collaboration with appropriate care team members and the patient's provider was performed as part of comprehensive patient evaluation and provision of chronic care management services.     Follow Up Plan: SW will follow up with patient by phone over the next 30 days  Eula Fried, [place], MSW, [email or web address] Phone: [phone]

## 2019-04-12 ENCOUNTER — Telehealth: Payer: Self-pay

## 2019-04-13 ENCOUNTER — Ambulatory Visit: Payer: Self-pay | Admitting: Licensed Clinical Social Worker

## 2019-04-13 NOTE — Chronic Care Management (AMB) (Signed)
  Chronic Care Management    Clinical Social Work General Note  04/13/2019 Name: DIVANTE KOTCH MRN: [id number] DOB: 09-29-1971  FRIEDRICH HARRIOTT is a 48 y.o. year old male who is a primary care patient of Crissman, Jeannette How, MD. The CCM was consulted to assist the patient with [employer]. LCSW was unable to reach patient successfully by phone today. LCSW rescheduled CCM appointment for 05/03/2019.   Review of patient status, including review of consultants reports, relevant laboratory and other test results, and collaboration with appropriate care team members and the patient's provider was performed as part of comprehensive patient evaluation and provision of chronic care management services.     Follow Up Plan: SW will follow up with patient by phone over the next 2-4 weeks      Eula Fried, [place], MSW, [email or web address] Phone: [phone]

## 2019-04-19 ENCOUNTER — Ambulatory Visit: Payer: Self-pay | Admitting: *Deleted

## 2019-04-19 ENCOUNTER — Ambulatory Visit: Payer: Self-pay | Admitting: Pharmacist

## 2019-04-19 ENCOUNTER — Telehealth: Payer: Self-pay

## 2019-04-19 DIAGNOSIS — Z598 Other problems related to housing and economic circumstances: Secondary | ICD-10-CM

## 2019-04-19 DIAGNOSIS — E10638 Type 1 diabetes mellitus with other oral complications: Secondary | ICD-10-CM

## 2019-04-19 DIAGNOSIS — Z599 Problem related to housing and economic circumstances, unspecified: Secondary | ICD-10-CM

## 2019-04-19 NOTE — Chronic Care Management (AMB) (Signed)
  Chronic Care Management   Note  04/19/2019 Name: ZAEEM KANDEL MRN: [id number] DOB: 08/27/1971  Dustin Ray is a 48 y.o. year old male who is a primary care patient of Crissman, Jeannette How, MD. The CCM team was consulted for assistance with chronic disease management and care coordination needs.    Contacted patient telephonically to follow up on medication management and blood sugars. Cell phone went straight to voicemail; left HIPAA compliant message for patient to return my call. Contacted work number, was informed that patient no longer worked at [employer].   Follow up plan: - If I do not hear back, will follow up in the next 30 days.   Catie Darnelle Maffucci, PharmD, [place] PGY2 Ambulatory Care Pharmacy Resident, [facility] Phone: [phone]

## 2019-04-19 NOTE — Chronic Care Management (AMB) (Signed)
  Chronic Care Management   Follow Up Note   04/19/2019 Name: [name] MRN: [id number] DOB: 1971-10-20  Referred by: [name], MD Reason for referral : No chief complaint on file.   Dustin Ray is a 48 y.o. year old male who is a primary care patient of Crissman, Jeannette How, MD. The CCM team was consulted for assistance with chronic disease management and care coordination needs.    Review of patient status, including review of consultants reports, relevant laboratory and other test results, and collaboration with appropriate care team members and the patient's provider was performed as part of comprehensive patient evaluation and provision of chronic care management services.    Goals Addressed            This Visit's Progress   . "I need to be able to eat celiac diet cheaper" (pt-stated)       Current Barriers:  . Film/video editor.   Nurse Case Manager Clinical Goal(s):  Marland Kitchen Over the next 30 days, patient will work with CCM RN to address needs related to being able to adhere to a Celiac Diet own a budget.  Interventions:  . Advised patient to on cheaper options for Celiac Diet. . Provided education to patient re: Book sent to patient's home"Gluten Free- On a Budget" also provided website https://david-yates.net/ . Social Work referral for low support  . Reviewed with patient his current financial situation related to his diet and medications. . 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 He was happy about this.  . Patient reports following up with [facility] to get a refill on his Tujeo  . Confirmed with patient he has Catie Parm D's contact info and requested he give her a call.  . Patient noted to be in better spirits during this phone call as opposed to past conversations.   Patient Self Care Activities:  . Performs ADL's independently . Performs IADL's independently   Please see past updates related to this goal by clicking on the "Past Updates" button in the selected goal          The CM team will reach out to the patient again over the next 30 days.  The patient has been provided with contact information for the chronic care management team and has been advised to call with any health related questions or concerns.   Merlene Morse Cyril Woodmansee RN, BSN Nurse Case Editor, commissioning Family Practice/[facility]  [phone]) Business Mobile

## 2019-04-19 NOTE — Patient Instructions (Signed)
Thank you allowing the Chronic Care Management Team to be a part of your care! It was a pleasure speaking with you today!  CCM (Chronic Care Management) Team   Emree Locicero RN, BSN Nurse Care Coordinator  [phone]  Catie [facility] PharmD  Clinical Pharmacist  [phone]  Eula Fried LCSW Clinical Social Worker [phone]  Goals Addressed            This Visit's Progress   . "I need to be able to eat celiac diet cheaper" (pt-stated)       Current Barriers:  . Film/video editor.   Nurse Case Manager Clinical Goal(s):  Marland Kitchen Over the next 30 days, patient will work with CCM RN to address needs related to being able to adhere to a Celiac Diet own a budget.  Interventions:  . Advised patient to on cheaper options for Celiac Diet. . Provided education to patient re: Book sent to patient's home"Gluten Free- On a Budget" also provided website https://david-yates.net/ . Social Work referral for low support  . Reviewed with patient his current financial situation related to his diet and medications. . 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 He was happy about this.  . Patient reports following up with the Med Management clinic to get a refill on his Tujeo  . Confirmed with patient he has Catie Parm D's contact info and requested he give her a call.  . Patient noted to be in better spirits during this phone call as opposed to past conversations.   Patient Self Care Activities:  . Performs ADL's independently . Performs IADL's independently  Please see past updates related to this goal by clicking on the "Past Updates" button in the selected goal         The patient verbalized understanding of instructions provided today and declined a print copy of patient instruction materials.   The CM team will reach out to the patient again over the next 30 days.

## 2019-05-03 ENCOUNTER — Telehealth: Payer: Self-pay

## 2019-05-11 ENCOUNTER — Other Ambulatory Visit: Payer: Self-pay | Admitting: Family Medicine

## 2019-05-11 ENCOUNTER — Telehealth: Payer: Self-pay

## 2019-05-17 ENCOUNTER — Telehealth: Payer: Self-pay

## 2019-05-23 ENCOUNTER — Telehealth: Payer: Self-pay | Admitting: Pharmacist

## 2019-05-23 NOTE — Telephone Encounter (Signed)
05/23/2019 10:07:05 AM - [name] refill to provider  05/23/2019 Mailing [employer] refill request to Dr. Golden Pop to sign and return for [facility] Inject 36 units daily at bedtime.Delos Haring

## 2019-05-31 ENCOUNTER — Ambulatory Visit: Payer: Self-pay | Admitting: Pharmacist

## 2019-05-31 ENCOUNTER — Ambulatory Visit: Payer: Self-pay | Admitting: *Deleted

## 2019-05-31 DIAGNOSIS — E10638 Type 1 diabetes mellitus with other oral complications: Secondary | ICD-10-CM

## 2019-05-31 DIAGNOSIS — K900 Celiac disease: Secondary | ICD-10-CM

## 2019-05-31 NOTE — Chronic Care Management (AMB) (Signed)
  Chronic Care Management   Follow Up Note   05/31/2019 Name: Dustin Ray MRN: [id number] DOB: [birth year]  Referred by: [name], MD Reason for referral : Chronic Care Management (DM1 and Celiac )   Dustin Ray is a 48 y.o. year old male who is a primary care patient of Crissman, Jeannette How, MD. The CCM team was consulted for assistance with chronic disease management and care coordination needs.    Review of patient status, including review of consultants reports, relevant laboratory and other test results, and collaboration with appropriate care team members and the patient's provider was performed as part of comprehensive patient evaluation and provision of chronic care management services.    Goals Addressed            This Visit's Progress   . "I need to be able to eat celiac diet cheaper" (pt-stated)       Current Barriers:  . Film/video editor.   Nurse Case Manager Clinical Goal(s):  Marland Kitchen Over the next 30 days, patient will work with CCM RN to address needs related to being able to adhere to a Celiac Diet own a budget.  Interventions:  . Reviewed with patient his current financial situation related to his diet and medications. . 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 Job continues to go well. He was happy about this, stated he will be eligible for insurance in 90 days. . Discussed podiatrist and eye doctor once insurance in place. . Patient reports following up with the [facility] Management clinic to get a refill on his Tujeo  . Patient denies cbg higher than 120 or lower than 60. Conts checking just one time per day.  Marland Kitchen Co-telephone visit with Pharm-D Catie.   Patient Self Care Activities:  . Performs ADL's independently . Performs IADL's independently  Please see past updates related to this goal by clicking on the "Past Updates" button in the selected goal          The care management team will reach out to the patient again over the  next 60 days.  The patient has been provided with contact information for the care management team and has been advised to call with any health related questions or concerns.    Merlene Morse Dustin Gilardi RN, BSN Nurse Case Editor, commissioning Family Practice/[facility]  [phone]) Business Mobile

## 2019-05-31 NOTE — Patient Instructions (Signed)
Visit Information  Goals Addressed            This Visit's Progress     Patient Stated   . "I have had lows because I changed  insulins. Need help with managing my sugars."  (pt-stated)       Current Barriers:  . Patient lost insurance coverage; has since been connected with [facility], and is now receiving Toujeo and Novolog.  . Reports fasting readings 60-100s; notes that he isn't checking other times of the day. Denies s/sx hypoglycemia. Patient has started a full time job about 3 weeks ago, so may be more active lately.   . Per chart review, [facility] is sending the PAP applications to clinic for Dr. Rance Muir signature.   Pharmacist Clinical Goal(s):  Marland Kitchen Over the next 90 days, patient will collaborate with PharmD on optimized medication management  Interventions:  . Encouraged patient to monitor fasting BG; if he notes readings more often in 60-80s rather than 80-110s, he could reduce Toujeo to 34 units QAM. Encouraged to contact clinic if hypoglycemia develops.  . Discussed need for eye and foot exams; patient notes he will be eligible for insurance through his company after 90 days of work, though he is unsure if he will be able to afford the premiums.   Patient Self Care Activities:  . Patient will continue to check blood sugars daily  . Patient will continue to collaborate [facility] to receive medications  Please see past updates related to this goal by clicking on the "Past Updates" button in the selected goal         The patient verbalized understanding of instructions provided today and declined a print copy of patient instruction materials.   Plan:  - Will continue to follow patient chronically with CCM team  Catie Darnelle Maffucci, PharmD Clinical Pharmacist [place] [phone]

## 2019-05-31 NOTE — Chronic Care Management (AMB) (Signed)
  Chronic Care Management   Follow Up Note   05/31/2019 Name: Dustin Ray MRN: [id number] DOB: [birth year]  Referred by: [name], MD Reason for referral : Chronic Care Management (Medication Management)   OLUMIDE DOLINGER is a 48 y.o. year old male who is a primary care patient of Crissman, Jeannette How, MD. The CCM team was consulted for assistance with chronic disease management and care coordination needs.    Contacted patient telephonically to follow up on medication management. Spoke with patient with Janci Minor, RN CM.  Review of patient status, including review of consultants reports, relevant laboratory and other test results, and collaboration with appropriate care team members and the patient's provider was performed as part of comprehensive patient evaluation and provision of chronic care management services.    Goals Addressed            This Visit's Progress     Patient Stated   . "I have had lows because I changed  insulins. Need help with managing my sugars."  (pt-stated)       Current Barriers:  . Patient lost insurance coverage; has since been [facility] with [facility], and is now receiving Toujeo and Novolog.  . Reports fasting readings 60-100s; notes that he isn't checking other times of the day. Denies s/sx hypoglycemia. Patient has started a full time job about 3 weeks ago, so may be more active lately.   . Per chart review, [facility] is sending the PAP applications to clinic for Dr. Rance Muir signature.   Pharmacist Clinical Goal(s):  Marland Kitchen Over the next 90 days, patient will collaborate with PharmD on optimized medication management  Interventions:  . Encouraged patient to monitor fasting BG; if he notes readings more often in 60-80s rather than 80-110s, he could reduce Toujeo to 34 units QAM. Encouraged to contact clinic if hypoglycemia develops.  . Discussed need for eye and foot exams; patient notes he will be eligible  for insurance through his company after 90 days of work, though he is unsure if he will be able to afford the premiums.   Patient Self Care Activities:  . Patient will continue to check blood sugars daily  . Patient will continue to collaborate [facility] to receive medications  Please see past updates related to this goal by clicking on the "Past Updates" button in the selected goal          Plan:  - Will continue to follow patient chronically with CCM team  Catie Darnelle Maffucci, PharmD Clinical Pharmacist [place] [phone]

## 2019-05-31 NOTE — Patient Instructions (Signed)
Thank you allowing the Chronic Care Management Team to be a part of your care! It was a pleasure speaking with you today!  CCM (Chronic Care Management) Team   Josealfredo Adkins RN, BSN Nurse Care Coordinator  [phone]  Catie [facility] PharmD  Clinical Pharmacist  [phone]  Eula Fried LCSW Clinical Social Worker [phone]  Goals Addressed            This Visit's Progress   . "I need to be able to eat celiac diet cheaper" (pt-stated)       Current Barriers:  . Film/video editor.   Nurse Case Manager Clinical Goal(s):  Marland Kitchen Over the next 30 days, patient will work with CCM RN to address needs related to being able to adhere to a Celiac Diet own a budget.  Interventions:  . Reviewed with patient his current financial situation related to his diet and medications. . 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 Job continues to go well. He was happy about this, stated he will be eligible for insurance in 90 days. . Discussed podiatrist and eye doctor once insurance in place. . Patient reports following up with the [facility] Management clinic to get a refill on his Tujeo  . Patient denies cbg higher than 120 or lower than 60. Conts checking just one time per day.  Marland Kitchen Co-telephone visit with Pharm-D Catie.   Patient Self Care Activities:  . Performs ADL's independently . Performs IADL's independently  Please see past updates related to this goal by clicking on the "Past Updates" button in the selected goal         The patient verbalized understanding of instructions provided today and declined a print copy of patient instruction materials.   The patient has been provided with contact information for the care management team and has been advised to call with any health related questions or concerns.

## 2019-08-02 ENCOUNTER — Ambulatory Visit: Payer: Self-pay | Admitting: *Deleted

## 2019-08-02 DIAGNOSIS — K900 Celiac disease: Secondary | ICD-10-CM

## 2019-08-02 DIAGNOSIS — E10638 Type 1 diabetes mellitus with other oral complications: Secondary | ICD-10-CM

## 2019-08-02 NOTE — Chronic Care Management (AMB) (Signed)
Chronic Care Management   Follow Up Note   08/02/2019 Name: Dustin Ray MRN: [id number] DOB: 1971/07/23  Referred by: [name], MD Reason for referral : Chronic Care Management (DM1 )   Dustin Ray is a 48 y.o. year old male who is a primary care patient of Crissman, Jeannette How, MD. The CCM team was consulted for assistance with chronic disease management and care coordination needs.    Review of patient status, including review of consultants reports, relevant laboratory and other test results, and collaboration with appropriate care team members and the patient's provider was performed as part of comprehensive patient evaluation and provision of chronic care management services.    SDOH (Social Determinants of Health) screening performed today: Financial Strain  Social Connections. See Care Plan for related entries.   Outpatient Encounter Medications as of 08/02/2019  Medication Sig Note  . acetaminophen (TYLENOL) 325 MG tablet Take 650 mg by mouth as needed.   . Ascorbic Acid (VITAMIN C PO) Take by mouth.   Marland Kitchen atorvastatin (LIPITOR) 10 MG tablet 1 a week   . CALCIUM CARBONATE-VITAMIN D PO Take by mouth. 12/03/2018: DIU   . insulin lispro (HUMALOG) 100 UNIT/ML injection Inject 0.2 mLs (20 Units total) into the skin 3 (three) times daily before meals. Inject 20 Units into the skin 3 (three) times daily with meals. Pt adjusts insulin to meals 10-30u,   . Insulin Pen Needle (B-D ULTRAFINE III SHORT PEN) 31G X 8 MM MISC USE WITH INSULIN PENS AS DIRECTED.   . Insulin Syringe-Needle U-100 (INSULIN SYRINGE .5CC/28G) 28G X 1/2" 0.5 ML MISC 1 application by Does not apply route 3 (three) times daily.   . Multiple Vitamins-Minerals (DAILY MULTIVITAMIN PO) Take by mouth daily.   . naproxen (NAPROSYN) 375 MG tablet Take 375 mg by mouth as needed.   . Pitavastatin Calcium (LIVALO) 1 MG TABS Take 1 tablet (1 mg total) by mouth daily. (Patient not taking: Reported on 03/02/2019)   . Pyridoxine  HCl (VITAMIN B-6 PO) Take by mouth.   . TOUJEO SOLOSTAR 300 UNIT/ML SOPN INJECT 36 UNITS UNDER THE SKIN BEFORE BED   . vitamin B-12 (CYANOCOBALAMIN) 1000 MCG tablet Take 1,000 mcg by mouth daily.    No facility-administered encounter medications on file as of 08/02/2019.      Goals Addressed            This Visit's Progress   . "I need to be able to eat celiac diet cheaper" (pt-stated)       Current Barriers:  . Film/video editor.   Nurse Case Manager Clinical Goal(s):  Marland Kitchen Over the next 30 days, patient will work with CCM RN to address needs related to being able to adhere to a Celiac Diet own a budget.  Interventions:  . Patient using brown rice flour for baking needs and feels with his current employment it is affordable. . Patient states he is continuing to use [facility] for medications related to no insurance. Patient is full time now at [employer], but is not eligible for their insurance yet.  . Patients denies lows and states "things are going well with diabetes"  . Discussed with patient who he has to check on him and he stated his next door neighbors are involved if he has needs.  . Patient denies needs at this time.   Patient Self Care Activities:  . Performs ADL's independently . Performs IADL's independently  Please see past updates related to  this goal by clicking on the "Past Updates" button in the selected goal          The care management team will reach out to the patient again over the next 60 days.    Merlene Morse Callum Wolf RN, BSN Nurse Case Editor, commissioning Family Practice/[facility]  [phone]) Business Mobile

## 2019-08-04 ENCOUNTER — Telehealth: Payer: Self-pay | Admitting: Pharmacist

## 2019-08-04 NOTE — Telephone Encounter (Signed)
08/04/2019 10:28:33 AM - Nelva Nay pen refill to provider  08/04/2019 Mailing [employer] refill request to Dr. Golden Pop @ [facility] [address] [place], [place] [address] to sign for [employer] Inject 36 units daily @ bedtime, #5.Delos Haring

## 2019-09-14 ENCOUNTER — Ambulatory Visit: Payer: Self-pay | Admitting: Pharmacist

## 2019-09-14 DIAGNOSIS — K900 Celiac disease: Secondary | ICD-10-CM

## 2019-09-14 DIAGNOSIS — Z599 Problem related to housing and economic circumstances, unspecified: Secondary | ICD-10-CM

## 2019-09-14 DIAGNOSIS — Z598 Other problems related to housing and economic circumstances: Secondary | ICD-10-CM

## 2019-09-14 DIAGNOSIS — E10638 Type 1 diabetes mellitus with other oral complications: Secondary | ICD-10-CM

## 2019-09-14 NOTE — Chronic Care Management (AMB) (Signed)
Chronic Care Management   Follow Up Note   09/14/2019 Name: Dustin Ray MRN: [id number] DOB: 1970-12-21  Referred by: [name], MD Reason for referral : Chronic Care Management (Medication Management)   Dustin Ray is a 48 y.o. year old male who is a primary care patient of Crissman, Jeannette How, MD. The CCM team was consulted for assistance with chronic disease management and care coordination needs.    Review of patient status, including review of consultants reports, relevant laboratory and other test results, and collaboration with appropriate care team members and the patient's provider was performed as part of comprehensive patient evaluation and provision of chronic care management services.    SDOH (Social Determinants of Health) screening performed today: Financial Strain . See Care Plan for related entries.   Advanced Directives Status: N See Care Plan and [employer] application for related entries.  Outpatient Encounter Medications as of 09/14/2019  Medication Sig Note  . acetaminophen (TYLENOL) 325 MG tablet Take 650 mg by mouth as needed.   . Ascorbic Acid (VITAMIN C PO) Take by mouth.   Marland Kitchen atorvastatin (LIPITOR) 10 MG tablet 1 a week   . CALCIUM CARBONATE-VITAMIN D PO Take by mouth. 12/03/2018: DIU   . insulin lispro (HUMALOG) 100 UNIT/ML injection Inject 0.2 mLs (20 Units total) into the skin 3 (three) times daily before meals. Inject 20 Units into the skin 3 (three) times daily with meals. Pt adjusts insulin to meals 10-30u,   . Insulin Pen Needle (B-D ULTRAFINE III SHORT PEN) 31G X 8 MM MISC USE WITH INSULIN PENS AS DIRECTED.   . Insulin Syringe-Needle U-100 (INSULIN SYRINGE .5CC/28G) 28G X 1/2" 0.5 ML MISC 1 application by Does not apply route 3 (three) times daily.   . Multiple Vitamins-Minerals (DAILY MULTIVITAMIN PO) Take by mouth daily.   . naproxen (NAPROSYN) 375 MG tablet Take 375 mg by mouth as needed.   . Pitavastatin Calcium (LIVALO) 1 MG TABS Take 1  tablet (1 mg total) by mouth daily. (Patient not taking: Reported on 03/02/2019)   . Pyridoxine HCl (VITAMIN B-6 PO) Take by mouth.   . TOUJEO SOLOSTAR 300 UNIT/ML SOPN INJECT 36 UNITS UNDER THE SKIN BEFORE BED   . vitamin B-12 (CYANOCOBALAMIN) 1000 MCG tablet Take 1,000 mcg by mouth daily.    No facility-administered encounter medications on file as of 09/14/2019.      Goals Addressed            This Visit's Progress     Patient Stated   . PharmD "I want to make sure I'm on the right medications" (pt-stated)       Current Barriers:  . Diabetes: uncontrolled but improved; most recent A1c 8.4% o Reports that he is feeling better now that he can afford his medications, but still expresses concerns with finances d/t being uninsured.  o Is unsure when he will qualify for insurance through his job . Current antihyperglycemic regimen: Toujeo 34-36 units QAM; Humalog sliding scale 1 units for every 25 mg (~6-8 units per meal);  o [facility] . Denies any episodes of low blood glucose . Current blood glucose readings: Fastings 90-110s . Cardiovascular risk reduction: o Current hypertensive regimen: none o Current hyperlipidemia regimen: none - notes that he is not taking atorvastatin  Pharmacist Clinical Goal(s):  Marland Kitchen Over the next 90 days, patient will work with PharmD and primary care provider to address optimized medication management  Interventions: . Comprehensive medication review  performed, medication list updated in electronic medical record . Discussed need to schedule follow up appointment with a provider for labwork. He notes that he wouldn't be able to afford a visit or labs right now. Discussed [facility]; patient notes that he doesn't want to go to a free clinic due to previous bad experiences . Discussed financial concerns. Placing Care Guide referral for assistance.   Patient Self Care Activities:  . Patient will check blood  glucose daily , document, and provide at future appointment . Patient will take medications as prescribed . Patient will report any questions or concerns to provider   Please see past updates related to this goal by clicking on the "Past Updates" button in the selected goal          Plan:  - Will continue to support patient with medication management needs - Will outreach next week to ensure Care Guide has contacted patient  Catie Darnelle Maffucci, PharmD Clinical Pharmacist [place] [phone]

## 2019-09-14 NOTE — Patient Instructions (Signed)
Visit Information  Goals Addressed            This Visit's Progress     Patient Stated   . PharmD "I want to make sure I'm on the right medications" (pt-stated)       Current Barriers:  . Diabetes: uncontrolled but improved; most recent A1c 8.4% o Reports that he is feeling better now that he can afford his medications, but still expresses concerns with finances d/t being uninsured.  o Is unsure when he will qualify for insurance through his job . Current antihyperglycemic regimen: Toujeo 34-36 units QAM; Humalog sliding scale 1 units for every 25 mg (~6-8 units per meal);  o [facility] . Denies any episodes of low blood glucose . Current blood glucose readings: Fastings 90-110s . Cardiovascular risk reduction: o Current hypertensive regimen: none o Current hyperlipidemia regimen: none - notes that he is not taking atorvastatin  Pharmacist Clinical Goal(s):  Marland Kitchen Over the next 90 days, patient will work with PharmD and primary care provider to address optimized medication management  Interventions: . Comprehensive medication review performed, medication list updated in electronic medical record . Discussed need to schedule follow up appointment with a provider for labwork. He notes that he wouldn't be able to afford a visit or labs right now. Discussed [facility]; patient notes that he doesn't want to go to a free clinic due to previous bad experiences . Discussed financial concerns. Placing Care Guide referral for assistance.   Patient Self Care Activities:  . Patient will check blood glucose daily , document, and provide at future appointment . Patient will take medications as prescribed . Patient will report any questions or concerns to provider   Please see past updates related to this goal by clicking on the "Past Updates" button in the selected goal         The patient verbalized understanding of instructions provided  today and declined a print copy of patient instruction materials.   Plan:  - Will continue to support patient with medication management needs - Will outreach next week to ensure Care Guide has contacted patient  Catie Darnelle Maffucci, PharmD Clinical Pharmacist [place] [phone]

## 2019-10-05 ENCOUNTER — Telehealth: Payer: Self-pay

## 2019-10-07 ENCOUNTER — Telehealth: Payer: Self-pay | Admitting: Family Medicine

## 2019-10-07 NOTE — Telephone Encounter (Signed)
From: Jill Alexanders [facility])  Sent: Friday, October 07, 2019 3:52 PM To: '[email or web address]' <[email or web address]> Subject: Caro [place] Kitchen Referral - [facility]  Good Afternoon, A patient of ours at [facility] suffers from Diabetes and has Celiac Disease. Would he be eligible for meals from [facility], would need to be low - sugar and no wheat or gluten?  He lives alone in [place] and is having a difficult time making ends meet. Sherrilyn Rist [address] [place], [place] [address] [phone] 48 y/o male  Thank you!   [place] Management ??[email or web address]   ??[phone]

## 2019-10-07 NOTE — Telephone Encounter (Signed)
From: Jill Alexanders [facility])  Sent: Friday, October 07, 2019 3:46 PM To: Burnett [email or web address]) [email or web address]> Subject: RE: [place]   Good Afternoon Caren Griffins, This pt showed up again on my list of referrals as needing help with finances and food. I am going to refer him to the [employer] to see if we can get him some meals delivered that are gluten free and wheat free and also accommodate his diabetes.   Could you please check to see if he would be eligible for financial assistance again through [facility]? If not mortgage assistance perhaps a [employer] card?  Thanks so much! (Attached is the application from March)

## 2019-10-07 NOTE — Telephone Encounter (Signed)
° ° °  Called pt regarding [employer] Referral for resources for food and financial assistance. Pt stated that he has celiac and has to have wheat free and gluten-free diet, helped patient with financial assistance in March of 2020 with mortgage assistance through [facility] have emailed Burnett Harry to see if he would be eligible for their assistance with [employer] gift card or the like. -Will email Caren Griffins at [place] send referral to [employer] to assist with meals.     [place] Management ??[email or web address]   ??[phone]

## 2019-10-10 NOTE — Telephone Encounter (Signed)
° ° °  Called pt regarding Community Resource Referral follow-up to dietary restrictions for [employer] left msg to call back  From: Jannifer [place] <[email or web address]>  Sent: Friday, October 07, 2019 8:41 PM To: Jill Alexanders [facility]) <[email or web address]> Subject: Re: [place] Referral - [place]  *Caution - External email - see footer for warningsCurt Bears,  With the enormity of seniors we are serving, I cannot guarantee dietary restrictions can be upheld.  We do try to incorporate as many fresh vegetables and generate meals that are senior-friendly.  We would love to serve him anyway, however, would he be knowledgeable about what he is able to eat and not eat?  We could send meals but he would have to discard what he cant eat.  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  Im working six days a week for free and want to help everyone but we just dont have the manpower or budget.  Let me know if we should still deliver meals.  He would have to take the dessert out, any breads, etc.  Kindly, [place] Management ??[email or web address]   ??[phone]

## 2019-10-12 NOTE — Telephone Encounter (Signed)
° ° °  Called pt to follow up regarding [employer] menu meal delivery to find out if pt agrees to that program delivering meals to his home and also to notify that [facility] will be providing a [employer] card and to coordinate pick up of the card from the practice. [place] Management ??[email or web address]   ??[phone]

## 2019-10-14 ENCOUNTER — Telehealth: Payer: Self-pay | Admitting: Family Medicine

## 2019-10-14 NOTE — Telephone Encounter (Signed)
° ° °  Called pt regarding Dustin Ray Referral for food resources, he declined Dustin Ray due to fear of cross contamination with his food and concern about his celiac disease. Gave him information about Dustin Ray food bank [address] and told him to consider reaching out to them to find fresh vegetables if available. He stated he would stop by. Dustin Ray agreed to donate Dustin Ray card and I will follow up with patient about picking that up from the practice.  [place] Management ??[email or web address]   ??[phone]

## 2019-10-14 NOTE — Telephone Encounter (Signed)
Closing referral pending any other needs of patient.  [place] Management ??[email or web address]   ??[phone]

## 2019-10-17 ENCOUNTER — Telehealth: Payer: Self-pay

## 2019-10-31 ENCOUNTER — Telehealth: Payer: Self-pay | Admitting: Pharmacist

## 2019-10-31 NOTE — Telephone Encounter (Signed)
10/31/2019 11:27:33 AM - Novolog Flexpen & tips refill to dr  10/31/2019 Mailing Dr. Golden Pop a refill request to sign & return for Novolog Flexpes & tips.Delos Haring

## 2019-11-01 ENCOUNTER — Telehealth: Payer: Self-pay | Admitting: Pharmacist

## 2019-11-01 NOTE — Telephone Encounter (Signed)
11/01/2019 11:05:16 AM - [name] refill to dr.  11/01/2019 I am mailing a Sanofi refill request for [name] Inject 36 units daily at bedtime #3 to Dr. Golden Pop to sign & return.Delos Haring

## 2019-11-02 NOTE — Telephone Encounter (Signed)
Signing encounter

## 2019-11-11 ENCOUNTER — Ambulatory Visit: Payer: Self-pay | Admitting: Licensed Clinical Social Worker

## 2019-11-13 NOTE — Chronic Care Management (AMB) (Signed)
  Care Management   Follow Up Note   11/13/2019 Name: KOLSEN CHOE MRN: [id number] DOB: 04/16/1971  Referred by: [name], MD Reason for referral : [place] is a 48 y.o. year old male who is a primary care patient of Crissman, Jeannette How, MD. The care management team was consulted for assistance with care management and care coordination needs.    Review of patient status, including review of consultants reports, relevant laboratory and other test results, and collaboration with appropriate care team members and the patient's provider was performed as part of comprehensive patient evaluation and provision of chronic care management services.    SDOH (Social Determinants of Health) screening performed today: Financial Strain . See Care Plan for related entries.   Patient reports that he is doing well and actively communicates with C3 guide in regards to gaining financial and food support. Patient reports "I'm getting by as best as I can." Patient confirms that he has C3's contact information.   The patient has been provided with contact information for the care management team and has been advised to call with any health related questions or concerns.   Eula Fried, BSW, MSW, [facility] Care Management [email or web address] Phone: [phone]

## 2019-11-14 ENCOUNTER — Telehealth: Payer: Self-pay

## 2019-11-14 NOTE — Telephone Encounter (Signed)
error 

## 2019-11-21 ENCOUNTER — Telehealth: Payer: Self-pay

## 2019-11-21 NOTE — Telephone Encounter (Signed)
Called patient to let him know that he needs an appointment so a provider can fill out his paper work. LVM for patient to return call.

## 2019-12-09 ENCOUNTER — Telehealth: Payer: Self-pay

## 2019-12-16 ENCOUNTER — Encounter: Payer: Self-pay | Admitting: Family Medicine

## 2019-12-16 ENCOUNTER — Ambulatory Visit (INDEPENDENT_AMBULATORY_CARE_PROVIDER_SITE_OTHER): Payer: Self-pay | Admitting: Family Medicine

## 2019-12-16 ENCOUNTER — Other Ambulatory Visit: Payer: Self-pay

## 2019-12-16 VITALS — BP 156/82 | HR 68 | Temp 97.9°F

## 2019-12-16 DIAGNOSIS — E7800 Pure hypercholesterolemia, unspecified: Secondary | ICD-10-CM

## 2019-12-16 DIAGNOSIS — E10638 Type 1 diabetes mellitus with other oral complications: Secondary | ICD-10-CM

## 2019-12-16 LAB — BAYER DCA HB A1C WAIVED: HB A1C (BAYER DCA - WAIVED): 9.8 % — ABNORMAL HIGH (ref <7.0)

## 2019-12-16 NOTE — Progress Notes (Signed)
BP (!) 156/82   Pulse 68   Temp 97.9 F (36.6 C)   SpO2 98%    Subjective:    Patient ID: Dustin Ray, male    DOB: [birth year], 49 y.o.   MRN: [id number]  HPI: Dustin Ray is a 49 y.o. male  Chief Complaint  Patient presents with  . Diabetes   Patient does not have any insurance. Had an A1c done last year, but has not had any other blood work done sine 2018.  DIABETES Hypoglycemic episodes:no Polydipsia/polyuria: no Visual disturbance: no Chest pain: no Paresthesias: no Glucose Monitoring: yes  Accucheck frequency: Daily  Fasting glucose: 60-140 Taking Insulin?: yes  Long acting insulin:  Short acting insulin: Blood Pressure Monitoring: not checking Retinal Examination: Not up to Date Foot Exam: Done today Diabetic Education: Completed Pneumovax: Not up to Date Influenza: Not up to Date Aspirin: no  Relevant past medical, surgical, family and social history reviewed and updated as indicated. Interim medical history since our last visit reviewed. Allergies and medications reviewed and updated.  Review of Systems  Constitutional: Negative.   Respiratory: Negative.   Cardiovascular: Negative.   Musculoskeletal: Negative.   Psychiatric/Behavioral: Negative.     Per HPI unless specifically indicated above     Objective:    BP (!) 156/82   Pulse 68   Temp 97.9 F (36.6 C)   SpO2 98%   Wt Readings from Last 3 Encounters:  12/03/18 168 lb (76.2 kg)  07/06/17 150 lb (68 kg)  03/31/17 155 lb (70.3 kg)    Physical Exam Vitals and nursing note reviewed.  Constitutional:      General: He is not in acute distress.    Appearance: Normal appearance. He is not ill-appearing, toxic-appearing or diaphoretic.  HENT:     Head: Normocephalic and atraumatic.     Right Ear: External ear normal.     Left Ear: External ear normal.     Nose: Nose normal.     Mouth/Throat:     Mouth: Mucous membranes are moist.     Pharynx: Oropharynx is clear.  Eyes:   General:        Right eye: No discharge.        Left eye: No discharge.     Extraocular Movements: Extraocular movements intact.     Conjunctiva/sclera: Conjunctivae normal.     Pupils: Pupils are equal, round, and reactive to light.  Cardiovascular:     Rate and Rhythm: Normal rate and regular rhythm.     Pulses: Normal pulses.     Heart sounds: Normal heart sounds. No murmur. No friction rub. No gallop.   Pulmonary:     Effort: Pulmonary effort is normal. No respiratory distress.     Breath sounds: Normal breath sounds. No stridor. No wheezing, rhonchi or rales.  Chest:     Chest wall: No tenderness.  Musculoskeletal:        General: Normal range of motion.     Cervical back: Normal range of motion and neck supple.  Skin:    General: Skin is warm and dry.     Capillary Refill: Capillary refill takes less than 2 seconds.     Coloration: Skin is not jaundiced.     Findings: No bruising, erythema, lesion or rash.  Neurological:     General: No focal deficit present.     Mental Status: He is oriented to person, place, and time. Mental status is at baseline.  Psychiatric:  Mood and Affect: Mood normal.        Behavior: Behavior normal.        Thought Content: Thought content normal.        Judgment: Judgment normal.   Foot/Ankle Musculoskeletal Exam  Diabetic Foot Exam - Simple   Simple Foot Form Diabetic Foot exam was performed with the following findings: Yes 12/16/2019 11:35 AM  Visual Inspection No deformities, no ulcerations, no other skin breakdown bilaterally: Yes Sensation Testing Intact to touch and monofilament testing bilaterally: Yes Pulse Check Posterior Tibialis and Dorsalis pulse intact bilaterally: Yes Comments      Results for orders placed or performed in visit on 12/16/19  Bayer DCA Hb A1c Waived  Result Value Ref Range   HB A1C (BAYER DCA - WAIVED) 9.8 (H) <7.0 %      Assessment & Plan:   Problem List Items Addressed This Visit       Endocrine   DM (diabetes mellitus), type 1 ([place]) - Primary    Going in the wrong direction with A1c of 9.8 up from 8.4. Patient has no insurance. States that he cannot afford to check his sugars. Cannot afford to have his bloodwork done. Cannot afford to come in for appointments. Long discussion today about how this is not good care. [facility] not [facility]. He is willing to go to [facility] if we can help him get some charity care. Will refer to [facility] endocrine and will also refer to CCM SW to see if we can help with charity care application. Follow up 6 months if not established with endocrine.       Relevant Orders   Comprehensive metabolic panel   Bayer DCA Hb A1c Waived (Completed)   Ambulatory referral to Endocrinology   Referral to Chronic Care Management Services     Other   Hyperlipidemia    Cannot tolerate statins except brand name lipitor. Currently refusing it. Will check labs. Await results.       Relevant Orders   Comprehensive metabolic panel       Follow up plan: Return in about 6 months (around 06/14/2020) for if not in with endocrine by then.

## 2019-12-17 NOTE — Assessment & Plan Note (Signed)
Going in the wrong direction with A1c of 9.8 up from 8.4. Patient has no insurance. States that he cannot afford to check his sugars. Cannot afford to have his bloodwork done. Cannot afford to come in for appointments. Long discussion today about how this is not good care. [facility] does not [facility] to. He is willing to go to [facility] if we can help him get some charity care. Will refer to [facility] endocrine and will also refer to CCM SW to see if we can help with charity care application. Follow up 6 months if not established with endocrine.

## 2019-12-17 NOTE — Assessment & Plan Note (Signed)
Cannot tolerate statins except brand name lipitor. Currently refusing it. Will check labs. Await results.

## 2019-12-18 LAB — COMPREHENSIVE METABOLIC PANEL
ALT: 15 IU/L (ref 0–44)
AST: 22 IU/L (ref 0–40)
Albumin/Globulin Ratio: 1.9 (ref 1.2–2.2)
Albumin: 4.1 g/dL (ref 4.0–5.0)
Alkaline Phosphatase: 84 IU/L (ref 39–117)
BUN/Creatinine Ratio: 15 (ref 9–20)
BUN: 15 mg/dL (ref 6–24)
Bilirubin Total: 0.3 mg/dL (ref 0.0–1.2)
CO2: 25 mmol/L (ref 20–29)
Calcium: 9.4 mg/dL (ref 8.7–10.2)
Chloride: 96 mmol/L (ref 96–106)
Creatinine, Ser: 1.01 mg/dL (ref 0.76–1.27)
GFR calc Af Amer: 100 mL/min/{1.73_m2} (ref >59)
GFR calc non Af Amer: 86 mL/min/{1.73_m2} (ref >59)
Globulin, Total: 2.2 g/dL (ref 1.5–4.5)
Glucose: 366 mg/dL — ABNORMAL HIGH (ref 65–99)
Potassium: 4.6 mmol/L (ref 3.5–5.2)
Sodium: 138 mmol/L (ref 134–144)
Total Protein: 6.3 g/dL (ref 6.0–8.5)

## 2019-12-19 ENCOUNTER — Encounter: Payer: Self-pay | Admitting: Family Medicine

## 2019-12-20 ENCOUNTER — Ambulatory Visit: Payer: Self-pay | Admitting: Licensed Clinical Social Worker

## 2019-12-20 DIAGNOSIS — Z599 Problem related to housing and economic circumstances, unspecified: Secondary | ICD-10-CM

## 2019-12-20 DIAGNOSIS — Z598 Other problems related to housing and economic circumstances: Secondary | ICD-10-CM

## 2019-12-20 NOTE — Chronic Care Management (AMB) (Signed)
  Care Management   Follow Up Note   12/20/2019 Name: Dustin Ray MRN: [id number] DOB: Mar 11, 1971  Referred by: [name], MD Reason for referral : [place] is a 49 y.o. year old male who is a primary care patient of Crissman, Jeannette How, MD. The care management team was consulted for assistance with care management and care coordination needs.    Review of patient status, including review of consultants reports, relevant laboratory and other test results, and collaboration with appropriate care team members and the patient's provider was performed as part of comprehensive patient evaluation and provision of chronic care management services.    LCSW forwarded new referral to C3 guide for [facility] program enrollment and education.,  Eula Fried, BSW, MSW, [email or web address] Phone: [phone]

## 2019-12-23 ENCOUNTER — Encounter: Payer: Self-pay | Admitting: Family Medicine

## 2019-12-23 ENCOUNTER — Telehealth: Payer: Self-pay | Admitting: Family Medicine

## 2019-12-23 NOTE — Telephone Encounter (Signed)
°  °  [facility] [address] [place], [place]  [address] Phone:  [phone]   Fax:  [phone]   December 23, 2019   Dustin Ray [address] [place] [address]   Dear Mr. Tinnon,  Thank you for taking the time to speak with me on the phone today regarding community resources for financial assistance through [facility].  Enclosed is the application. Please complete and mail to:  [facility] [address]  [place], [place] [address]   If you should have additional follow-up questions there are Financial Navigators available to meet with you in person at select clinic and hospital locations or contact the [facility] team toll free at [phone] or local at [phone].  I have also included my contact information below should you need further assistance,  [place] Management ??[email or web address]  ??[phone]

## 2019-12-23 NOTE — Telephone Encounter (Signed)
    Called pt regarding [employer] Referral for . Patient is a 49 y.o. male  and is under the care of Dr. Wynetta Emery at [facility]. Pt stated that he is working but cannot afford $115 a week for health insurance through his employer. He asked that I mail him the application for [employer]. Will include letter with my contact information should he need anything further.  [place] . Embedded Care Coordination [place]  Care Management ??[email or web address]  ??[phone]

## 2019-12-23 NOTE — Telephone Encounter (Signed)
Letter printed and sent.

## 2019-12-26 ENCOUNTER — Other Ambulatory Visit: Payer: Self-pay | Admitting: Family Medicine

## 2020-02-24 ENCOUNTER — Telehealth: Payer: Self-pay | Admitting: Pharmacy Technician

## 2020-02-24 NOTE — Telephone Encounter (Signed)
Patient still needs to provide [place] Tax Return.  Patient acknowledged that he understands that failure to provide the above requested documentation by 04/24/20 will result in a halt in medication assistance.  [facility]

## 2020-03-14 ENCOUNTER — Telehealth: Payer: Self-pay | Admitting: Pharmacist

## 2020-03-14 NOTE — Telephone Encounter (Signed)
03/14/2020 12:03:53 PM - [name] faxed to [place] - Wednesday, March 14, 2020 12:02 PM --Virgel Gess Sanofi enrollment application for [name] Inject 36 units daily at bedtime #3 with provider as Park Liter, DO @ [facility].

## 2020-03-20 ENCOUNTER — Telehealth: Payer: Self-pay | Admitting: Pharmacist

## 2020-03-20 NOTE — Telephone Encounter (Signed)
03/20/2020 12:09:02 PM - Novolog Flexpen renewal to pat. & dr  -- Elmer Picker - Tuesday, March 20, 2020 12:06 PM --[employer] renewal application to patient to sign & return for [employer] Inject 20 units three times a day (max 60 units daily ) #5 boxes & Novofine 32G tips #2 boxes, also mailing to provider to sign & return-Megan Wynetta Emery, DO @ [facility] [address] [place], [place] [address].

## 2020-07-12 ENCOUNTER — Telehealth: Payer: Self-pay | Admitting: Pharmacist

## 2020-07-12 NOTE — Telephone Encounter (Signed)
07/12/2020 10:08:22 AM - Dustin Ray Flexpen & tips refill to provider  -- Dustin Ray - Thursday, July 12, 2020 10:06 AM --[employer] refill request to provider for [employer] Inject 20 units three times daily (max 60 units daily) # 5 boxes & Novofine 32G tips # 2 boxes.   07/12/2020 10:06:15 AM - Dustin Ray Solostar refill to provider  07/12/2020 Mailing Sanofi refill request to provider for [name] Inject 36 units daily at bedtime # 3 boxes (need 3 for 90 days)

## 2020-08-10 ENCOUNTER — Other Ambulatory Visit: Payer: Self-pay | Admitting: Family Medicine

## 2020-08-27 ENCOUNTER — Telehealth: Payer: Self-pay | Admitting: Pharmacist

## 2020-08-27 NOTE — Telephone Encounter (Signed)
08/27/2020 8:41:04 AM - Novolog Flexpen & tips refill  -- Dustin Ray - Monday, August 27, 2020 8:39 AM --[employer] refill on [employer] Inject 20 units three times daily (60 units, 5 boxes) and Novofine 32G tips (2 boxes).

## 2020-11-12 ENCOUNTER — Telehealth: Payer: Self-pay | Admitting: Pharmacist

## 2020-11-12 NOTE — Telephone Encounter (Signed)
11/12/2020 2:12:52 PM - Dustin Ray refill to [place] - Monday, November 12, 2020 2:11 PM --Faxed Sanofi refill for [name] Inject 36 units into the skin daily at bedtime # 3 boxes.

## 2020-12-18 HISTORY — PX: OTHER SURGICAL HISTORY: SHX169

## 2020-12-26 ENCOUNTER — Other Ambulatory Visit: Payer: Self-pay | Admitting: Family Medicine

## 2020-12-26 NOTE — Telephone Encounter (Signed)
Please get patient scheduled for a follow up, has not been seen since 11/2019

## 2020-12-26 NOTE — Telephone Encounter (Signed)
Requested medication (s) are due for refill today - no  Requested medication (s) are on the active medication list -no  Future visit scheduled -no  Last refill: unknown  Notes to clinic: Rx request of medication no longer on medication list- not current. Sent for review   Requested Prescriptions  Pending Prescriptions Disp Refills   NOVOLOG FLEXPEN 100 UNIT/ML FlexPen [Pharmacy Med Name: NOVOLOG 100 UNITS/ML FLEXPEN] 75 mL 0    Sig: INJECT 20 UNITS UNDER THE SKIN 3 TIMES A DAY. MAX DAILY DOSE IS 60 UNITS.      Endocrinology:  Diabetes - Insulins Failed - 12/26/2020  2:55 PM      Failed - HBA1C is between 0 and 7.9 and within 180 days    Hemoglobin A1C  Date Value Ref Range Status  07/03/2016 8.4  Final   HB A1C (BAYER DCA - WAIVED)  Date Value Ref Range Status  12/16/2019 9.8 (H) <7.0 % Final    Comment:                                          Diabetic Adult            <7.0                                       Healthy Adult        4.3 - 5.7                                                           (DCCT/NGSP) American Diabetes Association's Summary of Glycemic Recommendations for Adults with Diabetes: Hemoglobin A1c <7.0%. More stringent glycemic goals (A1c <6.0%) may further reduce complications at the cost of increased risk of hypoglycemia.           Failed - Valid encounter within last 6 months    Recent Outpatient Visits           1 year ago Type 1 diabetes mellitus with other oral complication ([place])   [place], Megan P, DO   2 years ago Type 1 diabetes mellitus with other oral complication ([place])   [place], Jolene T, NP   3 years ago Type 1 diabetes mellitus with other oral complication ([place])   [facility] Crissman, Mark A, MD   3 years ago Type 1 diabetes mellitus with other oral complication ([place])   [place] Crissman, Jeannette How, MD   3 years ago Annual physical exam   [place], Jeannette How, MD                    Requested Prescriptions  Pending Prescriptions Disp Refills   NOVOLOG FLEXPEN 100 UNIT/ML FlexPen [Pharmacy Med Name: NOVOLOG 100 UNITS/ML FLEXPEN] 75 mL 0    Sig: INJECT 20 UNITS UNDER THE SKIN 3 TIMES A DAY. MAX DAILY DOSE IS 60 UNITS.      Endocrinology:  Diabetes - Insulins Failed - 12/26/2020  2:55 PM      Failed - HBA1C is between 0 and 7.9 and within  180 days    Hemoglobin A1C  Date Value Ref Range Status  07/03/2016 8.4  Final   HB A1C (BAYER DCA - WAIVED)  Date Value Ref Range Status  12/16/2019 9.8 (H) <7.0 % Final    Comment:                                          Diabetic Adult            <7.0                                       Healthy Adult        4.3 - 5.7                                                           (DCCT/NGSP) American Diabetes Association's Summary of Glycemic Recommendations for Adults with Diabetes: Hemoglobin A1c <7.0%. More stringent glycemic goals (A1c <6.0%) may further reduce complications at the cost of increased risk of hypoglycemia.           Failed - Valid encounter within last 6 months    Recent Outpatient Visits           1 year ago Type 1 diabetes mellitus with other oral complication ([place])   [place], Megan P, DO   2 years ago Type 1 diabetes mellitus with other oral complication ([place])   [place], Jolene T, NP   3 years ago Type 1 diabetes mellitus with other oral complication ([place])   [facility] Crissman, Mark A, MD   3 years ago Type 1 diabetes mellitus with other oral complication ([place])   [place], Jeannette How, MD   3 years ago Annual physical exam   [facility] Crissman, Jeannette How, MD

## 2020-12-27 NOTE — Telephone Encounter (Signed)
Lvm to make apt.

## 2020-12-28 ENCOUNTER — Telehealth: Payer: Self-pay

## 2020-12-28 NOTE — Telephone Encounter (Signed)
Copied from [place] [phone]. Topic: Complaint - Care >> Dec 28, 2020 12:24 PM Gillis Ends D wrote:  Well I'm not sure what triggered him, I begin making the appointment and in the appointment notes we put appoint reason, insurance, contact number and pec, so when  I asked if he had any insurance, He said ---- No, [facility] doctors at [facility] made him lose his job and that's why he hadn't been in cause he don't have no ----- insurance. So I asked him to hold while I contacted the office, Laural Roes told me you weren't in and to send a message. So i said okay, When I got back and begin going over the DT questions, He said I don't have no ---covid, I went to the hospital and they stuck a stick up his nose and now he gonna sue them for invading his privacy and other things he was saying I can't remember, he said he had a covid test and it was negative and he thinks the doctors started all this covid and he don't have the ---. So I just answered no the the DT questions and put failed to answer the questions in the space provided in the DT. At first I though his phone had went out because it got static on the line, but when he came back in he was still fussing about the [facility] doctors and then we were disconnected. I did reach out to him twice, but no response so his phone may have died. Not sure.     Route to Engineer, building services.

## 2020-12-28 NOTE — Telephone Encounter (Signed)
Lvm to make apt

## 2021-01-01 ENCOUNTER — Encounter: Payer: Self-pay | Admitting: Internal Medicine

## 2021-01-01 NOTE — Telephone Encounter (Signed)
FYI Unable to done message Pt was called did not answer letter was sent.

## 2021-01-01 NOTE — Telephone Encounter (Signed)
Unable to lvm sent letter.

## 2021-01-01 NOTE — Telephone Encounter (Signed)
Letter was sent to call and schedule a follow up.

## 2021-01-02 ENCOUNTER — Other Ambulatory Visit: Payer: Self-pay

## 2021-01-02 ENCOUNTER — Ambulatory Visit (INDEPENDENT_AMBULATORY_CARE_PROVIDER_SITE_OTHER): Payer: Self-pay | Admitting: Family Medicine

## 2021-01-02 ENCOUNTER — Other Ambulatory Visit: Payer: Self-pay | Admitting: Family Medicine

## 2021-01-02 ENCOUNTER — Encounter: Payer: Self-pay | Admitting: Family Medicine

## 2021-01-02 VITALS — BP 133/68 | HR 96 | Ht 72.0 in | Wt 150.4 lb

## 2021-01-02 DIAGNOSIS — E7800 Pure hypercholesterolemia, unspecified: Secondary | ICD-10-CM

## 2021-01-02 DIAGNOSIS — E10638 Type 1 diabetes mellitus with other oral complications: Secondary | ICD-10-CM

## 2021-01-02 DIAGNOSIS — Z599 Problem related to housing and economic circumstances, unspecified: Secondary | ICD-10-CM

## 2021-01-02 LAB — BAYER DCA HB A1C WAIVED: HB A1C (BAYER DCA - WAIVED): 7.2 % — ABNORMAL HIGH (ref <7.0)

## 2021-01-02 MED ORDER — INSULIN ASPART 100 UNIT/ML FLEXPEN: PEN_INJECTOR | SUBCUTANEOUS | 12 refills | Status: DC

## 2021-01-02 MED ORDER — TOUJEO SOLOSTAR 300 UNIT/ML ~~LOC~~ SOPN: PEN_INJECTOR | SUBCUTANEOUS | 1 refills | Status: DC

## 2021-01-02 MED ORDER — BD PEN NEEDLE SHORT U/F 31G X 8 MM MISC: 12 refills | Status: DC

## 2021-01-02 MED ORDER — "INSULIN SYRINGE 28G X 1/2"" 0.5 ML MISC": 1.0000 "application " | Freq: Three times a day (TID) | 12 refills | Status: AC

## 2021-01-02 NOTE — Assessment & Plan Note (Signed)
Doing well with an A1c of 7.2. Continue current regimen. We will see if we can get him in with endocrine once charity care is established.

## 2021-01-02 NOTE — Progress Notes (Signed)
BP 133/68   Pulse 96   Ht 6' (1.829 m)   Wt 150 lb 6.4 oz (68.2 kg)   SpO2 96%   BMI 20.40 kg/m    Subjective:    Patient ID: Dustin Ray, male    DOB: May 13, 1971, 50 y.o.   MRN: [id number]  HPI: Dustin Ray is a 50 y.o. male  Chief Complaint  Patient presents with  . Diabetes  . Hyperlipidemia   Was just in the hospital for a hip surgery. He says they kept him longer than he wanted to be there and that the doctor told him "he has COVID, he has pneumonia, he has TB, he has cancer." He notes that he can't have pneumonia because he hasn't had any rattling in his chest. His records from this hospitalization were not available at this time.   He has not gotten charity care as he doesn't want to go to [facility] due to concerns about them causing the pandemic. He would be interested in pursuing charity care through [place] to be able to get in with an endocrinologist.   DIABETES Hypoglycemic episodes:no Polydipsia/polyuria: no Visual disturbance: no Chest pain: no Paresthesias: no Glucose Monitoring: no Taking Insulin?: yes  Long acting insulin: tresiba  Short acting insulin: novolog Blood Pressure Monitoring: not checking Retinal Examination: Not up to Date Foot Exam: Not up to Date Diabetic Education: Completed Pneumovax: Up to Date Influenza: Not up to Date Aspirin: no  Relevant past medical, surgical, family and social history reviewed and updated as indicated. Interim medical history since our last visit reviewed. Allergies and medications reviewed and updated.  Review of Systems  Constitutional: Negative.   Respiratory: Negative.   Cardiovascular: Negative.   Gastrointestinal: Negative.   Skin: Negative.   Psychiatric/Behavioral: Negative.     Per HPI unless specifically indicated above     Objective:    BP 133/68   Pulse 96   Ht 6' (1.829 m)   Wt 150 lb 6.4 oz (68.2 kg)   SpO2 96%   BMI 20.40 kg/m   Wt Readings from Last 3 Encounters:  01/02/21 150  lb 6.4 oz (68.2 kg)  12/03/18 168 lb (76.2 kg)  07/06/17 150 lb (68 kg)    Physical Exam Vitals and nursing note reviewed.  Constitutional:      General: He is not in acute distress.    Appearance: Normal appearance. He is not ill-appearing, toxic-appearing or diaphoretic.  HENT:     Head: Normocephalic and atraumatic.     Right Ear: External ear normal.     Left Ear: External ear normal.     Nose: Nose normal.     Mouth/Throat:     Mouth: Mucous membranes are moist.     Pharynx: Oropharynx is clear.  Eyes:     General: No scleral icterus.       Right eye: No discharge.        Left eye: No discharge.     Extraocular Movements: Extraocular movements intact.     Conjunctiva/sclera: Conjunctivae normal.     Pupils: Pupils are equal, round, and reactive to light.  Cardiovascular:     Rate and Rhythm: Normal rate and regular rhythm.     Pulses: Normal pulses.     Heart sounds: Normal heart sounds. No murmur heard. No friction rub. No gallop.   Pulmonary:     Effort: Pulmonary effort is normal. No respiratory distress.     Breath sounds: Normal breath sounds. No  stridor. No wheezing, rhonchi or rales.  Chest:     Chest wall: No tenderness.  Musculoskeletal:        General: Normal range of motion.     Cervical back: Normal range of motion and neck supple.  Skin:    General: Skin is warm and dry.     Capillary Refill: Capillary refill takes less than 2 seconds.     Coloration: Skin is not jaundiced or pale.     Findings: No bruising, erythema, lesion or rash.  Neurological:     General: No focal deficit present.     Mental Status: He is alert and oriented to person, place, and time. Mental status is at baseline.  Psychiatric:        Mood and Affect: Mood normal.        Behavior: Behavior normal.        Thought Content: Thought content normal.        Judgment: Judgment normal.     Results for orders placed or performed in visit on 01/02/21  Bayer DCA Hb A1c Waived   Result Value Ref Range   HB A1C (BAYER DCA - WAIVED) 7.2 (H) <7.0 %      Assessment & Plan:   Problem List Items Addressed This Visit      Endocrine   DM (diabetes mellitus), type 1 ([place]) - Primary    Doing well with an A1c of 7.2. Continue current regimen. We will see if we can get him in with endocrine once charity care is established.       Relevant Medications   insulin glargine, 1 Unit Dial, (TOUJEO SOLOSTAR) 300 UNIT/ML Solostar Pen   insulin aspart (NOVOLOG) 100 UNIT/ML FlexPen   Insulin Pen Needle (B-D ULTRAFINE III SHORT PEN) 31G X 8 MM MISC   Other Relevant Orders   Bayer DCA Hb A1c Waived (Completed)   Microalbumin, Urine Waived   Comprehensive metabolic panel   CBC with Differential/Platelet   Lipid Panel w/o Chol/HDL Ratio   AMB Referral to [facility]     Other   Hyperlipidemia   Relevant Orders   Lipid Panel w/o Chol/HDL Ratio    Other Visit Diagnoses    Financial difficulties       Would be willing to pursue [facility] care. We will see if we can get him in with CCM to help with this.    Relevant Orders   AMB Referral to [place]       Follow up plan: Return in about 6 months (around 07/02/2021), or with Dr. Neomia Dear, for records release [facility].

## 2021-01-03 ENCOUNTER — Telehealth: Payer: Self-pay

## 2021-01-03 NOTE — Chronic Care Management (AMB) (Signed)
  Chronic Care Management   Note  01/03/2021 Name: JERNARD REIBER MRN: [id number] DOB: [birth year]  Nedra Hai Range is a 50 y.o. year old male who is a primary care patient of Vigg, Avanti, MD. ELDO UMANZOR is currently enrolled in care management services. An additional referral for LCSW was placed.   Follow up plan: Unsuccessful telephone outreach attempt made. A HIPAA compliant phone message was left for the patient providing contact information and requesting a return call.  The care management team will reach out to the patient again over the next 5 days.  If patient returns call to provider office, please advise to call [facility]  at [place], [place], [place], [place] [address] Direct Dial: [phone] [email or web address]

## 2021-01-04 NOTE — Chronic Care Management (AMB) (Signed)
  Chronic Care Management   Note  01/04/2021 Name: Dustin Ray MRN: [id number] DOB: 07/07/1971  [name] is a 50 y.o. year old male who is a primary care patient of Vigg, Avanti, MD. Dustin Ray is currently enrolled in care management services. An additional referral for LCSW was placed.   Follow up plan: Telephone appointment with care management team member scheduled for:01/14/2021  Noreene Larsson, [place], [place], [place] [address] Direct Dial: [phone] [email or web address] Website: [email or web address]

## 2021-01-14 ENCOUNTER — Ambulatory Visit: Payer: Self-pay | Admitting: Licensed Clinical Social Worker

## 2021-01-14 NOTE — Chronic Care Management (AMB) (Signed)
Care Management Clinical Social Work Note  01/14/2021 Name: Dustin Ray MRN: [id number] DOB: May 19, 1971  Dustin Ray is a 50 y.o. year old male who is a primary care patient of Vigg, Avanti, MD.  The Care Management team was consulted for assistance with chronic disease management and coordination needs.  Engaged with patient by telephone for follow up visit in response to provider referral for social work chronic care management and care coordination services  Consent to Services:  Mr. [name] was given information about Care Management services today including:  1. Care Management services includes personalized support from designated clinical staff supervised by his physician, including individualized plan of care and coordination with other care providers 2. 24/7 contact phone numbers for assistance for urgent and routine care needs. 3. The patient may stop case management services at any time by phone call to the office staff.  Patient agreed to services and consent obtained.   Assessment: Review of patient past medical history, allergies, medications, and health status, including review of relevant consultants reports was performed today as part of a comprehensive evaluation and provision of chronic care management and care coordination services.  SDOH (Social Determinants of Health) assessments and interventions performed:    Advanced Directives Status: Not addressed in this encounter.  Care Plan  Allergies  Allergen Reactions  . Atorvastatin Diarrhea  . Crestor [Rosuvastatin Calcium]     myalgias  . Gluten Meal   . Lovastatin     myalgias  . Pravastatin Other (See Comments)    myalgias  . Wheat Bran     Outpatient Encounter Medications as of 01/14/2021  Medication Sig Note  . Ascorbic Acid (VITAMIN C PO) Take by mouth.   Marland Kitchen CALCIUM CARBONATE-VITAMIN D PO Take by mouth. 12/03/2018: DIU   . insulin aspart (NOVOLOG) 100 UNIT/ML FlexPen Inject 0.2 mLs (20 Units total)  into the skin 3 (three) times daily before meals. Inject 20 Units into the skin 3 (three) times daily with meals. Pt adjusts insulin to meals 10-30u   . insulin glargine, 1 Unit Dial, (TOUJEO SOLOSTAR) 300 UNIT/ML Solostar Pen INJECT 36 UNITS UNDER THE SKIN BEFORE BED   . Insulin Pen Needle (B-D ULTRAFINE III SHORT PEN) 31G X 8 MM MISC USE WITH INSULIN PENS AS DIRECTED.   . Insulin Syringe-Needle U-100 (INSULIN SYRINGE .5CC/28G) 28G X 1/2" 0.5 ML MISC 1 application by Does not apply route 3 (three) times daily.   . Multiple Vitamins-Minerals (DAILY MULTIVITAMIN PO) Take by mouth daily.   . naproxen (NAPROSYN) 375 MG tablet Take 375 mg by mouth as needed.   Marland Kitchen oxyCODONE (OXY IR/ROXICODONE) 5 MG immediate release tablet Take by mouth.   . Pyridoxine HCl (VITAMIN B-6 PO) Take by mouth.   . vitamin B-12 (CYANOCOBALAMIN) 1000 MCG tablet Take 1,000 mcg by mouth daily.    No facility-administered encounter medications on file as of 01/14/2021.    Patient Active Problem List   Diagnosis Date Noted  . DM (diabetes mellitus), type 1 ([place]) 05/31/2015  . Hyperlipidemia 05/31/2015  . Celiac disease 05/31/2015    Care Plan : General Social Work (Adult)  Updates made by Greg Cutter, LCSW since 01/14/2021 12:00 AM    Problem: Barriers to Treatment     Problem: Coping Skills (General Plan of Care)     Long-Range Goal: Coping Skills Enhanced   Start Date: 01/14/2021  Priority: Medium  Note:   Evidence-based guidance:   Acknowledge, normalize and validate difficulty of making life-long  lifestyle changes.   Identify current effective and ineffective coping strategies.   Encourage patient and caregiver participation in care to increase self-esteem, confidence and feelings of control.   Consider alternative and complementary therapy approaches such as meditation, mindfulness or yoga.   Encourage participation in cognitive behavioral therapy to foster a positive identity, increase self-awareness,  as well as bolster self-esteem, confidence and self-efficacy.   Discuss spirituality; be present as concerns are identified; encourage journaling, prayer, worship services, meditation or pastoral counseling.   Encourage participation in pleasurable group activities such as hobbies, singing, sports or volunteering).   Encourage the use of mindfulness; refer for training or intensive intervention.   Consider the use of meditative movement therapy such as tai chi, yoga or qigong.   Promote a regular daily exercise program based on tolerance, ability and patient choice to support positive thinking about disease or aging.   Notes:    Timeframe:  Long-Range Goal Priority:  Medium Start Date:  01/14/21                          Expected End Date: 04/13/21                      Follow Up Date - 03/08/21   - avoid negative self-talk - develop a personal safety plan - develop a plan to deal with triggers like holidays, anniversaries - exercise at least 2 to 3 times per week - have a plan for how to handle bad days - journal feelings and what helps to feel better or worse - spend time or talk with others at least 2 to 3 times per week - spend time or talk with others every day - watch for early signs of feeling worse - write in journal every day    Why is this important?    Keeping track of your progress will help your treatment team find the right mix of medicine and therapy for you.   Write in your journal every day.   Day-to-day changes in depression symptoms are normal. It may be more helpful to check your progress at the end of each week instead of every day.     Current barriers:   . Acute Mental Health needs related to Stress Management due to physical complaints and financial strain  Currently unable to independently self manage needs related to mental health conditions.  Knowledge Deficits related to short term plan for care coordination  needs and long term plans for chronic  disease management . Lacks knowledge of where and how to connect for financial and mental health support  . Needs Support, Education, and Care Coordination in order to meet unmet need.   Clinical Goal(s): Over the next 120 days, patient will work with SW to reduce or manage symptoms of stress   Clinical Interventions:  . Assessed patient's previous treatment, needs, coping skills, current treatment, support system and barriers to care . Patient has a hip replacement surgery on 12/18/20 and is having difficulty walking still. He reports having no family but confirms to have some supportive friends that check in on him. He reports that he has paid all of his bills for this month and does not need to gain financial assistance this month but may benefit from a C3 referral during next outreach if he is unable to afford bills. CCM LCSW educated patient on local financial support resources such as EBT, food banks, DSS's low  income energy assistance program, Medicaid and Solicitor. Patient will contact CCM LCSW or [facility] front desk if he has any urgent financial needs.  . Patient interviewed and appropriate assessments performed or reviewed: brief mental health assessment;Suicidal Ideation/Homicidal Ideation: No . Provided basic mental health support, education and interventions  . Discussed several options for long term counseling based on need and insurance but patient denied needing mental health support as he is more focused on recovering from recent surgery  . Reviewed mental health medications with patient prescribed by PCP and discussed compliance  . Other interventions include: Motivational Interviewing, Solution-Focused Strategies, Brief CBT, and Teaching/Coaching Strategies   . Collaboration with PCP regarding development and update of comprehensive plan of care as evidenced by provider attestation and co-signature . Inter-disciplinary care team collaboration (see longitudinal plan of  care)  Patient Goals/Self-Care Activities: Over the next 120 days . Implement interventions discussed today to decreases symptoms of stress and increase knowledge and/or ability of: coping skills, healthy habits, self-management skills, and stress reduction.   Task: Support Psychosocial Response to Risk or Actual Health Condition   Note:   Care Management Activities:    - active listening utilized - counseling provided - current coping strategies identified - decision-making supported - healthy lifestyle promoted - journaling promoted - meditative movement therapy encouraged - mindfulness encouraged - participation in counseling encouraged - problem-solving facilitated - relaxation techniques promoted - self-reflection promoted - spiritual activities promoted - verbalization of feelings encouraged    Notes:       Follow Up Plan: SW will follow up with patient by phone over the next quarter  Eula Fried, BSW, MSW, [email or web address] Phone: [phone]

## 2021-02-19 ENCOUNTER — Other Ambulatory Visit: Payer: Self-pay

## 2021-02-26 ENCOUNTER — Other Ambulatory Visit: Payer: Self-pay

## 2021-02-26 ENCOUNTER — Telehealth: Payer: Self-pay | Admitting: *Deleted

## 2021-02-26 MED FILL — Insulin Pen Needle 31 G X 8 MM (1/3" or 5/16"): 30 days supply | Qty: 100 | Fill #0 | Status: CN

## 2021-02-26 NOTE — Chronic Care Management (AMB) (Signed)
  Care Management   Note  02/26/2021 Name: Dustin Ray MRN: [id number] DOB: 04-27-1971  Dustin Ray is a 50 y.o. year old male who is a primary care patient of Vigg, Avanti, MD and is actively engaged with the care management team. I reached out to Sherrilyn Rist by phone today to assist with re-scheduling a follow up visit with the Licensed Clinical Social Worker  Follow up plan: Unsuccessful telephone outreach attempt made. A HIPAA compliant phone message was left for the patient providing contact information and requesting a return call.  The care management team will reach out to the patient again over the next 7 days.  If patient returns call to provider office, please advise to call [place] Lysle Morales at [facility]

## 2021-02-27 ENCOUNTER — Other Ambulatory Visit: Payer: Self-pay

## 2021-02-27 NOTE — Chronic Care Management (AMB) (Signed)
  Care Management   Note  02/27/2021 Name: Dustin Ray MRN: [id number] DOB: 21-Nov-1971  Dustin Ray is a 50 y.o. year old male who is a primary care patient of Vigg, Avanti, MD and is actively engaged with the care management team. I reached out to Sherrilyn Rist by phone today to assist with re-scheduling a follow up visit with the Licensed Clinical Social Worker  Follow up plan: Telephone appointment with care management team member scheduled for: 04/05/2021   [facility]

## 2021-02-28 ENCOUNTER — Other Ambulatory Visit: Payer: Self-pay

## 2021-03-01 ENCOUNTER — Other Ambulatory Visit: Payer: Self-pay

## 2021-03-08 ENCOUNTER — Telehealth: Payer: Self-pay

## 2021-04-05 ENCOUNTER — Telehealth: Payer: Self-pay

## 2021-04-11 ENCOUNTER — Other Ambulatory Visit: Payer: Self-pay

## 2021-04-16 ENCOUNTER — Other Ambulatory Visit: Payer: Self-pay

## 2021-04-18 ENCOUNTER — Ambulatory Visit: Payer: Self-pay | Admitting: Pharmacy Technician

## 2021-04-18 ENCOUNTER — Encounter (INDEPENDENT_AMBULATORY_CARE_PROVIDER_SITE_OTHER): Payer: Self-pay

## 2021-04-18 ENCOUNTER — Other Ambulatory Visit: Payer: Self-pay

## 2021-04-18 DIAGNOSIS — Z79899 Other long term (current) drug therapy: Secondary | ICD-10-CM

## 2021-04-19 ENCOUNTER — Telehealth: Payer: Self-pay | Admitting: Pharmacist

## 2021-04-19 NOTE — Telephone Encounter (Signed)
04/19/2021 10:04:49 AM - [name] & Novolog Flexpen to dr  -- Dustin Ray - Friday, Apr 19, 2021 10:01 AM --Dustin Ray talked with patient in office today, got patient to sign forms to renew for Novolog Flexpen & tips, & Toujeo Solostar--mailing forms to provider to sign & return.   Toujeo Solostar Inject 36 units daily at bedtime # 3 boxes Novolog Flexpen Inject 20 units three times a day (max 60 units) #5 boxes & Novofine 32G tips.

## 2021-04-24 ENCOUNTER — Telehealth: Payer: Self-pay | Admitting: Pharmacist

## 2021-04-24 NOTE — Telephone Encounter (Signed)
Patient approved for medication assistance at [facility] until 02/22/22, as long as eligibility criteria continues to be met.   Vonda Henderson [facility] Administrative Assistant 

## 2021-04-29 ENCOUNTER — Other Ambulatory Visit: Payer: Self-pay

## 2021-05-02 ENCOUNTER — Telehealth: Payer: Self-pay | Admitting: Pharmacist

## 2021-05-02 NOTE — Telephone Encounter (Signed)
05/02/2021 2:43:22 PM - Novolog Flexpen & tips renewal to [facility] - Thursday, May 02, 2021 2:41 PM --[employer] renewal for [employer] Inject 20 units three times a day--Max daily dose 60 units  (5 boxes) and Novofine 32G tips ( 5 boxes). Sent letter explaining that patient has reported to our office that he currently has No Income, he is supporting himself by using cash he has. I also enclosed the notice that patient signed stating this and he has no bank accounts. Sent his 2021 tax return with income showing $41,354.   05/02/2021 2:40:44 PM - Obie Dredge renewal faxed to [facility] - Thursday, May 02, 2021 2:36 PM --Faxed [employer] renewal for [name] Inject 36 units daily at bedtime # 3 boxes. Sent letter explaining that patient has reported to our office that he currently has No Income, he is supporting himself by using cash he has. I also enclosed the notice that patient signed stating this and he has no bank accounts. Sent his 2021 tax return with income showing $41,354.

## 2021-05-09 ENCOUNTER — Ambulatory Visit: Payer: Self-pay | Admitting: Licensed Clinical Social Worker

## 2021-05-09 NOTE — Patient Instructions (Signed)
Visit Information   Goals Addressed             This Visit's Progress    Track and Manage My Symptoms-Stress Management   On track    Timeframe:  Long-Range Goal Priority:  Medium Start Date:  01/14/21                          Expected End Date: 04/13/21                      Follow Up Date - 07/26/21  Patient Goals/Self-Care Activities: Over the next 120 days Implement interventions discussed today to decreases symptoms of stress and increase knowledge and/or ability of: coping skills, healthy habits, self-management skills, and stress reduction.          Patient verbalizes understanding of instructions provided today.   Telephone follow up appointment with care management team member scheduled for:07/26/21  Christa See, MSW, [email or web address] Phone [phone] 12:09 PM

## 2021-05-09 NOTE — Chronic Care Management (AMB) (Signed)
Care Management Clinical Social Work Note  05/09/2021 Name: Dustin Ray MRN: [id number] DOB: October 30, 1971  Dustin Ray is a 50 y.o. year old male who is a primary care patient of Vigg, Avanti, MD.  The Care Management team was consulted for assistance with chronic disease management and coordination needs.  Engaged with patient by telephone for follow up visit in response to provider referral for social work chronic care management and care coordination services  Consent to Services:  Dustin Ray was given information about Care Management services today including:  Care Management services includes personalized support from designated clinical staff supervised by his physician, including individualized plan of care and coordination with other care providers 24/7 contact phone numbers for assistance for urgent and routine care needs. The patient may stop case management services at any time by phone call to the office staff.  Patient agreed to services and consent obtained.   Assessment: Patient is currently experiencing symptoms of  stress which seems to be exacerbated by financial strain and minimal support. Patient reports frustration with healthcare and [name] government due to limited funding options. He is active in seeking employment. Strategies to assist with stress management discussed and supportive resources provided. See Care Plan below for interventions and patient self-care actives. Recent life changes [name]: Financial strain Recommendation: Patient may benefit from, and is in agreement to work with LCSW to address care coordination needs and will continue to work with the clinical team to address health care and disease management related needs.  Follow up Plan: Patient would like continued follow-up.  CCM LCSW will follow up with patient 07/26/21. Patient will call office if needed prior to next encounter.  SDOH (Social Determinants of Health) assessments and interventions  performed:    Advanced Directives Status: Not addressed in this encounter.  Care Plan  Allergies  Allergen Reactions   Atorvastatin Diarrhea   Crestor [Rosuvastatin Calcium]     myalgias   Gluten Meal    Lovastatin     myalgias   Pravastatin Other (See Comments)    myalgias   Wheat Bran     Outpatient Encounter Medications as of 05/09/2021  Medication Sig Note   Ascorbic Acid (VITAMIN C PO) Take by mouth.    CALCIUM CARBONATE-VITAMIN D PO Take by mouth. 12/03/2018: DIU    insulin aspart (NOVOLOG) 100 UNIT/ML FlexPen INJECT 20 UNITS INTO THE SKIN 3 TIMES A DAY BEFORE MEALS. (ADJUST INSULIN TO MEALS 10-30UNITS) MAX DAILY 60 UNITS    insulin glargine, 1 Unit Dial, (TOUJEO SOLOSTAR) 300 UNIT/ML Solostar Pen INJECT 36 UNITS UNDER THE SKIN BEFORE BED    insulin glargine, 2 Unit Dial, (TOUJEO MAX) 300 UNIT/ML Solostar Pen INJECT 36 UNITS UNDER THE SKIN BEFORE BED    Insulin Pen Needle 31G X 8 MM MISC USE WITH INSULIN PENS AS DIRECTED.    Insulin Syringe-Needle U-100 (INSULIN SYRINGE .5CC/28G) 28G X 1/2" 0.5 ML MISC 1 application by Does not apply route 3 (three) times daily.    Multiple Vitamins-Minerals (DAILY MULTIVITAMIN PO) Take by mouth daily.    naproxen (NAPROSYN) 375 MG tablet Take 375 mg by mouth as needed.    NOVOFINE PEN NEEDLE 32G X 6 MM MISC AS DIRECTED WITH NOVOLOG FLEXPEN    Pyridoxine HCl (VITAMIN B-6 PO) Take by mouth.    TOUJEO SOLOSTAR 300 UNIT/ML Solostar Pen INJECT 36 UNITS UNDER THE SKIN ONCE DAILY AT BEDTIME    ULTICARE SHORT PEN NEEDLES 31G X 8 MM MISC USE AS  DIRECTED WITH INSULIN    vitamin B-12 (CYANOCOBALAMIN) 1000 MCG tablet Take 1,000 mcg by mouth daily.    No facility-administered encounter medications on file as of 05/09/2021.    Patient Active Problem List   Diagnosis Date Noted   DM (diabetes mellitus), type 1 ([place]) 05/31/2015   Hyperlipidemia 05/31/2015   Celiac disease 05/31/2015    Conditions to be addressed/monitored:  Stress ; Financial  constraints and Limited social support  Care Plan : General Social Work (Adult)  Updates made by Rebekah Chesterfield, LCSW since 05/09/2021 12:00 AM     Problem: Coping Skills (General Plan of Care)      Long-Range Goal: Coping Skills Enhanced   Start Date: 01/14/2021  This Visit's Progress: On track  Priority: Medium  Note:   Timeframe:  Long-Range Goal Priority:  Medium Start Date:  01/14/21                          Expected End Date: 08/23/21                      Follow Up Date - 07/26/21   Current barriers:   Acute Mental Health needs related to Stress Management due to physical complaints and financial strain Currently unable to independently self manage needs related to mental health conditions. Knowledge Deficits related to short term plan for care coordination  needs and long term plans for chronic disease management Lacks knowledge of where and how to connect for financial and mental health support  Needs Support, Education, and Care Coordination in order to meet unmet need.   Clinical Goal(s): Over the next 120 days, patient will work with SW to reduce or manage symptoms of stress   Clinical Interventions:  Assessed patient's previous treatment, needs, coping skills, current treatment, support system and barriers to care Patient interviewed and appropriate assessments performed or reviewed: brief mental health assessment;Suicidal Ideation/Homicidal Ideation: No Provided basic mental health support, education and interventions  Patient reports that he broke his hip at work and had a total hip replacement the beginning of the year. Patient was fired the day he broke his hip and has recently been looking for employment  Patient reports that he was ineligible to file for unemployment. He has had no income for 5 months and has been living off of savings from [employer] life insurance "Year has been nothing about bad luck for me" Patient has been active at local job fairs to find  employment. CCM LCSW discussed local resources that assist with obtaining employment. Patient is aware and utilizes [employer] online to submit applications Patient reports frustration with the medical system and [place] government due to limited resources for those experiencing financial strain. He reports that he doesn't want to see PCP due to inability to afford medical bills CCM LCSW discussed [facility] Financial Assistance Program that patient, where patient may be eligible for a discount between 50-100 %. Patient provided verbal consent for CCM LCSW to mail application to address on file.  Strategies to assist with stress management discussed. Patient is not interested in medication management or therapy. States he has a strong relationship with God Patient receives patient assistance through [facility], in regards, to his medications that cost approx $1000 monthly.  Discussed several options for long term counseling based on need and insurance but patient denied needing mental health support, including, psychiatry and therapy Other interventions include: Motivational Interviewing, Solution-Focused Strategies, Brief CBT,  and Teaching/Coaching Strategies   Collaboration with PCP regarding development and update of comprehensive plan of care as evidenced by provider attestation and co-signature Inter-disciplinary care team collaboration (see longitudinal plan of care)  Patient Goals/Self-Care Activities: Over the next 120 days Implement interventions discussed today to decreases symptoms of stress and increase knowledge and/or ability of: coping skills, healthy habits, self-management skills, and stress reduction      Christa See, MSW, [email or web address] Phone [phone] 11:08 AM

## 2021-05-17 ENCOUNTER — Other Ambulatory Visit: Payer: Self-pay

## 2021-05-23 ENCOUNTER — Other Ambulatory Visit: Payer: Self-pay

## 2021-05-23 ENCOUNTER — Other Ambulatory Visit: Payer: Self-pay | Admitting: Family Medicine

## 2021-05-23 NOTE — Telephone Encounter (Signed)
Lvm to make this apt. 

## 2021-05-23 NOTE — Telephone Encounter (Signed)
   Notes to clinic:  review requested change from pharmacy for refill    Requested Prescriptions  Pending Prescriptions Disp Refills   insulin glargine, 2 Unit Dial, (TOUJEO MAX SOLOSTAR) 300 UNIT/ML Solostar Pen 3 mL 1    Sig: INJECT 36 UNITS UNDER THE SKIN BEFORE BED      Endocrinology:  Diabetes - Insulins Passed - 05/23/2021  2:31 PM      Passed - HBA1C is between 0 and 7.9 and within 180 days    Hemoglobin A1C  Date Value Ref Range Status  07/03/2016 8.4  Final   HB A1C (BAYER DCA - WAIVED)  Date Value Ref Range Status  01/02/2021 7.2 (H) <7.0 % Final    Comment:                                          Diabetic Adult            <7.0                                       Healthy Adult        4.3 - 5.7                                                           (DCCT/NGSP) American Diabetes Association's Summary of Glycemic Recommendations for Adults with Diabetes: Hemoglobin A1c <7.0%. More stringent glycemic goals (A1c <6.0%) may further reduce complications at the cost of increased risk of hypoglycemia.           Passed - Valid encounter within last 6 months    Recent Outpatient Visits           4 months ago Type 1 diabetes mellitus with other oral complication [facility])   Gordon, Megan P, DO   1 year ago Type 1 diabetes mellitus with other oral complication ([place])   Palmer, Megan P, DO   2 years ago Type 1 diabetes mellitus with other oral complication ([place])   Kelly, Jolene T, NP   3 years ago Type 1 diabetes mellitus with other oral complication ([place])   [facility] Crissman, Mark A, MD   4 years ago Type 1 diabetes mellitus with other oral complication [facility])   [facility] Crissman, Jeannette How, MD

## 2021-05-29 ENCOUNTER — Other Ambulatory Visit: Payer: Self-pay

## 2021-05-31 ENCOUNTER — Other Ambulatory Visit: Payer: Self-pay

## 2021-05-31 MED FILL — Insulin Aspart Soln Pen-injector 100 Unit/ML: SUBCUTANEOUS | 25 days supply | Qty: 15 | Fill #0 | Status: CN

## 2021-05-31 MED FILL — Insulin Pen Needle 32 G X 6 MM (1/4" or 15/64"): 90 days supply | Qty: 400 | Fill #0 | Status: CN

## 2021-06-03 ENCOUNTER — Other Ambulatory Visit: Payer: Self-pay

## 2021-06-04 ENCOUNTER — Other Ambulatory Visit: Payer: Self-pay

## 2021-06-05 ENCOUNTER — Other Ambulatory Visit: Payer: Self-pay | Admitting: Internal Medicine

## 2021-06-05 ENCOUNTER — Other Ambulatory Visit: Payer: Self-pay

## 2021-06-05 MED ORDER — INSULIN GLARGINE (2 UNIT DIAL) 300 UNIT/ML ~~LOC~~ SOPN: 36.0000 [IU] | PEN_INJECTOR | Freq: Every day | SUBCUTANEOUS | 0 refills | Status: DC

## 2021-06-05 NOTE — Telephone Encounter (Signed)
Med refill sent. Needs apt.

## 2021-06-05 NOTE — Telephone Encounter (Signed)
Patient wants refill on insulin glargine, 2 Unit Dial, (TOUJEO MAX) 300 UNIT/ML Solostar Pen. Received fax from medication management clinic at [facility] for refill.

## 2021-06-21 ENCOUNTER — Other Ambulatory Visit: Payer: Self-pay

## 2021-07-01 ENCOUNTER — Other Ambulatory Visit: Payer: Self-pay

## 2021-07-02 ENCOUNTER — Telehealth: Payer: Self-pay | Admitting: Pharmacist

## 2021-07-02 NOTE — Telephone Encounter (Signed)
Received [employer] invoice dated 05/21/2021 where we received Novolog Flexpen (5) boxes & Novofine 32G tips (4) boxes--note on invoice-RTS for non pick up-patient last picked up in March 2022.

## 2021-07-08 ENCOUNTER — Other Ambulatory Visit: Payer: Self-pay

## 2021-07-08 MED FILL — Insulin Aspart Soln Pen-injector 100 Unit/ML: SUBCUTANEOUS | 100 days supply | Qty: 60 | Fill #0 | Status: AC

## 2021-07-09 ENCOUNTER — Other Ambulatory Visit: Payer: Self-pay

## 2021-07-09 MED FILL — Insulin Pen Needle 32 G X 6 MM (1/4" or 15/64"): 134 days supply | Qty: 400 | Fill #0 | Status: CN

## 2021-07-09 NOTE — Progress Notes (Signed)
Received updated proof of income.  Patient eligible to receive medication assistance at [facility] until time for re-certification in 2023, and as long as eligibility requirements continue to be met.  Dustin Ray J. Clyde Zarrella Care Manager [facility]  

## 2021-07-10 ENCOUNTER — Other Ambulatory Visit: Payer: Self-pay

## 2021-07-26 ENCOUNTER — Telehealth: Payer: Self-pay

## 2021-07-26 ENCOUNTER — Telehealth: Payer: Self-pay | Admitting: Licensed Clinical Social Worker

## 2021-07-26 NOTE — Telephone Encounter (Signed)
    Clinical Social Work  Care Management   Phone Outreach    07/26/2021 Name: SKIPPY MARHEFKA MRN: [id number] DOB: 01-03-1971  RICKIE GANGE is a 50 y.o. year old male who is a primary care patient of Vigg, Avanti, MD .   Reason for referral: [employer] , Financial Difficulties, and stress.    F/U phone call today to assess needs, progress and barriers with care plan goals.   Telephone outreach was unsuccessful.  Plan:CCM LCSW will wait for return call. If no return call is received, Will route chart to Care Guide to see if patient would like to reschedule phone appointment   Review of patient status, including review of consultants reports, relevant laboratory and other test results, and collaboration with appropriate care team members and the patient's provider was performed as part of comprehensive patient evaluation and provision of care management services.    Christa See, MSW, [email or web address] Phone [phone] 4:15 PM

## 2021-08-09 ENCOUNTER — Telehealth: Payer: Self-pay

## 2021-08-09 NOTE — Chronic Care Management (AMB) (Signed)
  Care Management   Note  08/09/2021 Name: [name] MRN: [id number] DOB: 16-Feb-1971  Dustin Ray is a 50 y.o. year old male who is a primary care patient of Vigg, Avanti, MD and is actively engaged with the care management team. I reached out to Sherrilyn Rist by phone today to assist with re-scheduling a follow up visit with the Licensed Clinical Social Worker  Follow up plan: Unsuccessful telephone outreach attempt made. A HIPAA compliant phone message was left for the patient providing contact information and requesting a return call.  The care management team will reach out to the patient again over the next 7 days.  If patient returns call to provider office, please advise to call [facility]  at [place], [address], [place], [place] [address] Direct Dial: [phone] [email or web address] Website: [email or web address]

## 2021-08-16 ENCOUNTER — Other Ambulatory Visit: Payer: Self-pay

## 2021-08-19 NOTE — Chronic Care Management (AMB) (Signed)
  Care Management   Note  08/19/2021 Name: Dustin Ray MRN: [id number] DOB: 1971/03/21  Dustin Ray is a 50 y.o. year old male who is a primary care patient of Vigg, Avanti, MD and is actively engaged with the care management team. I reached out to Sherrilyn Rist by phone today to assist with re-scheduling a follow up visit with the Licensed Clinical Social Worker  Follow up plan: Unsuccessful telephone outreach attempt made. A HIPAA compliant phone message was left for the patient providing contact information and requesting a return call.  The care management team will reach out to the patient again over the next 7 days.  If patient returns call to provider office, please advise to call [facility]  at [place], [address], [place], [place] [address] Direct Dial: [phone] [email or web address] Website: [email or web address]

## 2021-08-28 ENCOUNTER — Other Ambulatory Visit: Payer: Self-pay

## 2021-09-12 ENCOUNTER — Other Ambulatory Visit: Payer: Self-pay

## 2021-10-02 ENCOUNTER — Other Ambulatory Visit: Payer: Self-pay

## 2021-10-02 ENCOUNTER — Ambulatory Visit: Payer: Self-pay

## 2021-10-02 DIAGNOSIS — Z79899 Other long term (current) drug therapy: Secondary | ICD-10-CM

## 2021-10-02 NOTE — Progress Notes (Signed)
[facility] Visit Note  Patient: Dustin Ray MRN: [id number] Date of Birth: 14-Jan-1971 PCP: Charlynne Cousins, MD   Sherrilyn Rist 50 y.o. male presents for a yearly MTM visit today.  There were no vitals taken for this visit.  Patient Information   Past Medical History:  Diagnosis Date   Celiac disease    Diabetes mellitus without complication ([place])    Hyperlipidemia       Past Surgical History:  Procedure Laterality Date   CERVICAL DISC SURGERY     right hip replacement  12/18/2020     Family History  Problem Relation Age of Onset   Diabetes Mother    Cancer Father     New Diagnoses (since last visit):   Family Support: Good  Lifestyle Diet: Breakfast:cereal Lunch:jimmy dean pre-made meals Dinner:meat, vegetable  Drinks:coffee, diet sodas, water, Gatorade             Social History   Substance and Sexual Activity  Alcohol Use No   Alcohol/week: 0.0 standard drinks      Social History   Tobacco Use  Smoking Status Every Day   Packs/day: 0.50   Types: Cigarettes  Smokeless Tobacco Never      Health Maintenance  Topic Date Due   COVID-19 Vaccine (1) Never done   Zoster Vaccines- Shingrix (1 of 2) Never done   Pneumococcal Vaccine 40-21 Years old (2 - PCV) 05/15/2007   COLONOSCOPY (Pts 45-31yr Insurance coverage will need to be confirmed)  Never done   TETANUS/TDAP  07/15/2015   OPHTHALMOLOGY EXAM  06/03/2016   URINE MICROALBUMIN  03/31/2018   FOOT EXAM  12/15/2020   INFLUENZA VACCINE  06/24/2021   HEMOGLOBIN A1C  07/02/2021   Hepatitis C Screening  01/02/2022 (Originally 10/14/1987)   HIV Screening  Completed   HPV VACCINES  Aged Out   Health Maintenance/Date Completed  Last ED visit: no recent visits  Last Visit to PCP: 01/03/2021 Next Visit to PCP: no follow-up scheduled  Specialist Visit: 12/18/2020 Dental Exam: >5 years  Eye Exam: >5 years  Prostate Exam: 11-12 years ago Pelvic/PAP Exam: N/A Mammogram:  N/A DEXA: N/A Colonoscopy: 11-12 years ago  Flu Vaccine: First of October  Pneumonia Vaccine: not received  COVID-19 Vaccine: not received  Shingrix Vaccine: not received    Outpatient Encounter Medications as of 10/02/2021  Medication Sig   Ascorbic Acid (VITAMIN C PO) Take by mouth.   CALCIUM CARBONATE-VITAMIN D PO Take by mouth.   insulin aspart (NOVOLOG) 100 UNIT/ML FlexPen INJECT 20 UNITS INTO THE SKIN 3 TIMES A DAY BEFORE MEALS. (ADJUST INSULIN TO MEALS 10-30UNITS) MAX DAILY 60 UNITS   insulin glargine, 2 Unit Dial, (TOUJEO MAX) 300 UNIT/ML Solostar Pen Inject 36 Units into the skin at bedtime. No further refills until appointment   Insulin Pen Needle 31G X 8 MM MISC USE WITH INSULIN PENS AS DIRECTED.   Insulin Syringe-Needle U-100 (INSULIN SYRINGE .5CC/28G) 28G X 1/2" 0.5 ML MISC 1 application by Does not apply route 3 (three) times daily.   Multiple Vitamins-Minerals (DAILY MULTIVITAMIN PO) Take by mouth daily.   naproxen (NAPROSYN) 375 MG tablet Take 375 mg by mouth as needed.   NOVOFINE PEN NEEDLE 32G X 6 MM MISC USE AS DIRECTED WITH NOVOLOG FLEXPEN   Pyridoxine HCl (VITAMIN B-6 PO) Take by mouth.   ULTICARE SHORT PEN NEEDLES 31G X 8 MM MISC USE AS DIRECTED WITH INSULIN   vitamin B-12 (CYANOCOBALAMIN) 1000 MCG tablet Take 1,000 mcg  by mouth daily.   No facility-administered encounter medications on file as of 10/02/2021.     Assessment and Plan: Medication adherence: reports good adherence to medications and no missed doses  DM --Currently receiving Toujeo and Novolog sliding scale and reports no issues --Checks glucose as prescribed, and reports readings are within goal HLD --Not currently receiving treatment, last LDL in 2018 was 89  Encouraged patient to continue dietary changes. Provided information on [facility] as primary care provider. Plan to return to clinic in 1 year for annual MTM.  RTC: 1 year  Narda Rutherford, PharmD Pharmacy Resident  10/02/2021 3:45 PM

## 2021-10-14 NOTE — Chronic Care Management (AMB) (Signed)
  Care Management   Note  10/14/2021 Name: [name] MRN: [id number] DOB: Mar 27, 1971  Dustin Ray is a 50 y.o. year old male who is a primary care patient of Vigg, Avanti, MD and is actively engaged with the care management team. I reached out to Sherrilyn Rist by phone today to assist with re-scheduling a follow up visit with the Licensed Clinical Social Worker  Follow up plan: Unable to make contact on outreach attempts x 3. PCP Vigg, Avanti, MD notified via routed documentation in medical record.   Noreene Larsson, [place], [place], [place] [address] Direct Dial: [phone] [email or web address] Website: [email or web address]

## 2021-10-17 MED FILL — Insulin Aspart Soln Pen-injector 100 Unit/ML: SUBCUTANEOUS | 100 days supply | Qty: 60 | Fill #1 | Status: AC

## 2021-10-21 ENCOUNTER — Other Ambulatory Visit: Payer: Self-pay

## 2021-10-21 MED FILL — Insulin Pen Needle 31 G X 8 MM (1/3" or 5/16"): 33 days supply | Qty: 100 | Fill #0 | Status: AC

## 2021-10-22 ENCOUNTER — Other Ambulatory Visit: Payer: Self-pay

## 2021-10-23 ENCOUNTER — Other Ambulatory Visit: Payer: Self-pay

## 2021-10-24 ENCOUNTER — Other Ambulatory Visit: Payer: Self-pay

## 2021-10-24 MED ORDER — TOUJEO MAX SOLOSTAR 300 UNIT/ML ~~LOC~~ SOPN: PEN_INJECTOR | SUBCUTANEOUS | 0 refills | Status: DC

## 2021-11-28 ENCOUNTER — Other Ambulatory Visit: Payer: Self-pay

## 2021-11-28 ENCOUNTER — Other Ambulatory Visit: Payer: Self-pay | Admitting: Internal Medicine

## 2021-11-28 NOTE — Telephone Encounter (Signed)
Requested medication (s) are due for refill today   Yes  Last ordered 01/02/21  Requested medication (s) are on the active medication list Yes  Future visit scheduled No. Attempted to reach the patient. Left VM to return call and schedule appointment.  LOV 01/02/21.  Note to clinic-Patient is 5 months past due for appointment. TC to the patient today. Routing to physician for review.   Requested Prescriptions  Pending Prescriptions Disp Refills   NOVOLOG FLEXPEN 100 UNIT/ML FlexPen [Pharmacy Med Name: insulin aspart (NOVOLOG) 100 UNIT/ML FlexPen] 15 mL 12    Sig: INJECT 20 UNITS INTO THE SKIN 3 TIMES A DAY BEFORE MEALS. (ADJUST INSULIN TO MEALS 10-30UNITS) MAX DAILY 60 UNITS     Endocrinology:  Diabetes - Insulins Failed - 11/28/2021  8:28 AM      Failed - HBA1C is between 0 and 7.9 and within 180 days    Hemoglobin A1C  Date Value Ref Range Status  07/03/2016 8.4  Final   HB A1C (BAYER DCA - WAIVED)  Date Value Ref Range Status  01/02/2021 7.2 (H) <7.0 % Final    Comment:                                          Diabetic Adult            <7.0                                       Healthy Adult        4.3 - 5.7                                                           (DCCT/NGSP) American Diabetes Association's Summary of Glycemic Recommendations for Adults with Diabetes: Hemoglobin A1c <7.0%. More stringent glycemic goals (A1c <6.0%) may further reduce complications at the cost of increased risk of hypoglycemia.           Failed - Valid encounter within last 6 months    Recent Outpatient Visits           11 months ago Type 1 diabetes mellitus with other oral complication [facility])   [place], Megan P, DO   1 year ago Type 1 diabetes mellitus with other oral complication ([place])   Ursina, Megan P, DO   2 years ago Type 1 diabetes mellitus with other oral complication ([place])   Murdo, Jolene T, NP   4 years  ago Type 1 diabetes mellitus with other oral complication ([place])   [facility] Crissman, Mark A, MD   4 years ago Type 1 diabetes mellitus with other oral complication [facility])   [facility] Crissman, Jeannette How, MD

## 2021-12-02 ENCOUNTER — Other Ambulatory Visit: Payer: Self-pay

## 2021-12-03 ENCOUNTER — Ambulatory Visit (INDEPENDENT_AMBULATORY_CARE_PROVIDER_SITE_OTHER): Payer: Self-pay | Admitting: Internal Medicine

## 2021-12-03 ENCOUNTER — Other Ambulatory Visit: Payer: Self-pay

## 2021-12-03 ENCOUNTER — Encounter: Payer: Self-pay | Admitting: Internal Medicine

## 2021-12-03 VITALS — BP 135/82 | HR 70 | Temp 98.2°F | Ht 72.01 in | Wt 156.2 lb

## 2021-12-03 DIAGNOSIS — E10638 Type 1 diabetes mellitus with other oral complications: Secondary | ICD-10-CM

## 2021-12-03 DIAGNOSIS — Z125 Encounter for screening for malignant neoplasm of prostate: Secondary | ICD-10-CM

## 2021-12-03 DIAGNOSIS — E7800 Pure hypercholesterolemia, unspecified: Secondary | ICD-10-CM

## 2021-12-03 DIAGNOSIS — K900 Celiac disease: Secondary | ICD-10-CM

## 2021-12-03 DIAGNOSIS — Z1329 Encounter for screening for other suspected endocrine disorder: Secondary | ICD-10-CM | POA: Insufficient documentation

## 2021-12-03 LAB — BAYER DCA HB A1C WAIVED: HB A1C (BAYER DCA - WAIVED): 10 % — ABNORMAL HIGH (ref 4.8–5.6)

## 2021-12-03 MED ORDER — TOUJEO MAX SOLOSTAR 300 UNIT/ML ~~LOC~~ SOPN
PEN_INJECTOR | SUBCUTANEOUS | 0 refills | Status: DC
  Filled 2021-12-03: qty 3, fill #0

## 2021-12-03 MED ORDER — INSULIN ASPART 100 UNIT/ML FLEXPEN
PEN_INJECTOR | SUBCUTANEOUS | 12 refills | Status: AC
  Filled 2021-12-03: qty 15, fill #0
  Filled 2022-01-30: qty 15, 25d supply, fill #0
  Filled 2022-02-19: qty 75, 125d supply, fill #1

## 2021-12-03 NOTE — Progress Notes (Signed)
BP 135/82    Pulse 70    Temp 98.2 F (36.8 C) (Oral)    Ht 6' 0.01" (1.829 m)    Wt 156 lb 3.2 oz (70.9 kg)    SpO2 97%    BMI 21.18 kg/m    Subjective:    Patient ID: Dustin Ray, male    DOB: 06/14/1971, 51 y.o.   MRN: [id number]  Chief Complaint  Patient presents with   Diabetes   Hyperlipidemia    HPI: Dustin Ray is a 51 y.o. male  Diabetes He presents for his follow-up (diabetes - fsbs - average 123 - Fasting 50 -100.) diabetic visit. He has type 1 (takes novolog and toujeo - 36 units q pm and SSI of novolog) diabetes mellitus. Pertinent negatives for hypoglycemia include no confusion, dizziness, headaches, speech difficulty or tremors. Pertinent negatives for diabetes include no chest pain, no fatigue, no polydipsia, no polyphagia, no polyuria and no weakness.   Chief Complaint  Patient presents with   Diabetes   Hyperlipidemia    Relevant past medical, surgical, family and social history reviewed and updated as indicated. Interim medical history since our last visit reviewed. Allergies and medications reviewed and updated.  Review of Systems  Constitutional:  Negative for activity change, appetite change, chills, fatigue and fever.  HENT:  Negative for congestion, ear discharge, ear pain and facial swelling.   Eyes:  Negative for pain, discharge and itching.  Respiratory:  Negative for cough, chest tightness, shortness of breath and wheezing.   Cardiovascular:  Negative for chest pain, palpitations and leg swelling.  Gastrointestinal:  Negative for abdominal distention, abdominal pain, blood in stool, constipation, diarrhea, nausea and vomiting.  Endocrine: Negative for cold intolerance, heat intolerance, polydipsia, polyphagia and polyuria.  Genitourinary:  Negative for difficulty urinating, dysuria, flank pain, frequency, hematuria and urgency.  Musculoskeletal:  Negative for arthralgias, gait problem, joint swelling and myalgias.  Skin:  Negative for color  change, rash and wound.  Neurological:  Negative for dizziness, tremors, speech difficulty, weakness, light-headedness, numbness and headaches.  Hematological:  Does not bruise/bleed easily.  Psychiatric/Behavioral:  Negative for agitation, confusion, decreased concentration, sleep disturbance and suicidal ideas.    Per HPI unless specifically indicated above     Objective:    BP 135/82    Pulse 70    Temp 98.2 F (36.8 C) (Oral)    Ht 6' 0.01" (1.829 m)    Wt 156 lb 3.2 oz (70.9 kg)    SpO2 97%    BMI 21.18 kg/m   Wt Readings from Last 3 Encounters:  12/03/21 156 lb 3.2 oz (70.9 kg)  01/02/21 150 lb 6.4 oz (68.2 kg)  12/03/18 168 lb (76.2 kg)    Physical Exam Pt not examined secondary to his behaviour.  Results for orders placed or performed in visit on 12/03/21  Bayer DCA Hb A1c Waived  Result Value Ref Range   HB A1C (BAYER DCA - WAIVED) 10.0 (H) 4.8 - 5.6 %        Current Outpatient Medications:    Ascorbic Acid (VITAMIN C PO), Take by mouth., Disp: , Rfl:    CALCIUM CARBONATE-VITAMIN D PO, Take by mouth., Disp: , Rfl:    Insulin Pen Needle 31G X 8 MM MISC, USE WITH INSULIN PENS AS DIRECTED., Disp: 100 each, Rfl: 12   Insulin Syringe-Needle U-100 (INSULIN SYRINGE .5CC/28G) 28G X 1/2" 0.5 ML MISC, 1 application by Does not apply route 3 (three) times daily., Disp:  100 each, Rfl: 12   Multiple Vitamins-Minerals (DAILY MULTIVITAMIN PO), Take by mouth daily., Disp: , Rfl:    naproxen (NAPROSYN) 375 MG tablet, Take 375 mg by mouth as needed., Disp: , Rfl:    Pyridoxine HCl (VITAMIN B-6 PO), Take by mouth., Disp: , Rfl:    vitamin B-12 (CYANOCOBALAMIN) 1000 MCG tablet, Take 1,000 mcg by mouth daily., Disp: , Rfl:    insulin aspart (NOVOLOG) 100 UNIT/ML FlexPen, INJECT 20 UNITS INTO THE SKIN 3 TIMES A DAY BEFORE MEALS. (ADJUST INSULIN TO MEALS 10-30UNITS) MAX DAILY 60 UNITS, Disp: 15 mL, Rfl: 12   insulin glargine, 2 Unit Dial, (TOUJEO MAX SOLOSTAR) 300 UNIT/ML Solostar Pen,  INJECT 36 UNITS UNDER THE SKIN ONCE DAILY AT BEDTIME., Disp: 3 mL, Rfl: 0    Assessment & Plan:  Dm A1c at 10.0 ia on novolog and toujeo max refills sent in to pharmac Will need to fu with endocrinology asap.  Pt was very rude and irritative at the visit. Refuses labs - despite being Type 1 Dmhas NOT had a CMP/ Urine / regular diabetic labs x 2 years now. Was supposed to come back in August and no showed in September.   Pt non compliant to medical advice and has not kept his appointments for the last 2 years. He cannot afford care sec to self pay and has been extensively  offered care / approproate referral have been made to  charity care /  [facility] and was set up with this in thje past to help, however did not do this.  Pt was set up with CCM team and he didn't answer their calls per chart review.  Was referred to Endocrinology. Didn't keep this appointment either.  Pt was very inappropriate at this visit.  He doesn't want to do any labs and  choose to only get his A1c at this visit. He was rude and disrespectful to the lab staff as well.  He has a problem with seeing everyone at this visit.  Pt  is repetitive about the fact that his medications needs to go to the medical management clinic of [facility].Refills sent and this was reiterated to him at this visit.  There has been a breach in the physician patient relationship secondary to his inappropriate demeanor. We will refill his medications today and he will need to follow up with his medical needs with a new provider.      Problem List Items Addressed This Visit       Digestive   Celiac disease   Relevant Orders   Microalbumin, Urine Waived (STAT)   PSA Total+%Free (Serial)   Comprehensive metabolic panel   CBC with Differential/Platelet   Thyroid Panel With TSH   Bayer DCA Hb A1c Waived (Completed)   Lipid panel     Endocrine   DM (diabetes mellitus), type 1 (HCC) - Primary   Relevant Medications   insulin aspart (NOVOLOG)  100 UNIT/ML FlexPen   insulin glargine, 2 Unit Dial, (TOUJEO MAX SOLOSTAR) 300 UNIT/ML Solostar Pen   Other Relevant Orders   Microalbumin, Urine Waived (STAT)   PSA Total+%Free (Serial)   Comprehensive metabolic panel   CBC with Differential/Platelet   Thyroid Panel With TSH   Bayer DCA Hb A1c Waived (Completed)   Lipid panel     Other   Hyperlipidemia   Relevant Orders   Microalbumin, Urine Waived (STAT)   PSA Total+%Free (Serial)   Comprehensive metabolic panel   CBC with Differential/Platelet  Thyroid Panel With TSH   Bayer DCA Hb A1c Waived (Completed)   Lipid panel   Screening for thyroid disorder   Relevant Orders   Microalbumin, Urine Waived (STAT)   PSA Total+%Free (Serial)   Comprehensive metabolic panel   CBC with Differential/Platelet   Thyroid Panel With TSH   Bayer DCA Hb A1c Waived (Completed)   Lipid panel   Screening PSA (prostate specific antigen)   Relevant Orders   Microalbumin, Urine Waived (STAT)   PSA Total+%Free (Serial)   Comprehensive metabolic panel   CBC with Differential/Platelet   Thyroid Panel With TSH   Bayer DCA Hb A1c Waived (Completed)   Lipid panel     Orders Placed This Encounter  Procedures   Microalbumin, Urine Waived (STAT)   PSA Total+%Free (Serial)   Comprehensive metabolic panel   CBC with Differential/Platelet   Thyroid Panel With TSH   Bayer DCA Hb A1c Waived   Lipid panel     Meds ordered this encounter  Medications   insulin aspart (NOVOLOG) 100 UNIT/ML FlexPen    Sig: INJECT 20 UNITS INTO THE SKIN 3 TIMES A DAY BEFORE MEALS. (ADJUST INSULIN TO MEALS 10-30UNITS) MAX DAILY 60 UNITS    Dispense:  15 mL    Refill:  12   insulin glargine, 2 Unit Dial, (TOUJEO MAX SOLOSTAR) 300 UNIT/ML Solostar Pen    Sig: INJECT 36 UNITS UNDER THE SKIN ONCE DAILY AT BEDTIME.    Dispense:  3 mL    Refill:  0     Follow up plan: No follow-ups on file.

## 2021-12-04 ENCOUNTER — Other Ambulatory Visit: Payer: Self-pay

## 2021-12-04 MED ORDER — TOUJEO SOLOSTAR 300 UNIT/ML ~~LOC~~ SOPN
36.0000 [IU] | PEN_INJECTOR | Freq: Every day | SUBCUTANEOUS | 2 refills | Status: DC
Start: 2021-12-04 — End: 2022-03-26
  Filled 2021-12-04: qty 4.5, 38d supply, fill #0

## 2021-12-05 NOTE — Addendum Note (Signed)
Addended by: Charlynne Cousins on: 12/05/2021 10:22 AM   Modules accepted: Level of Service

## 2021-12-06 ENCOUNTER — Other Ambulatory Visit: Payer: Self-pay | Admitting: Internal Medicine

## 2021-12-06 ENCOUNTER — Other Ambulatory Visit: Payer: Self-pay

## 2021-12-06 NOTE — Telephone Encounter (Signed)
Requested by interface surescripts. Insulin glargine 2 units changed to 1 unit 12/04/21.  Requested Prescriptions  Refused Prescriptions Disp Refills   insulin glargine, 2 Unit Dial, (TOUJEO MAX SOLOSTAR) 300 UNIT/ML Solostar Pen 3 mL 0    Sig: INJECT 36 UNITS UNDER THE SKIN ONCE DAILY AT BEDTIME.     Endocrinology:  Diabetes - Insulins Failed - 12/06/2021  9:31 AM      Failed - HBA1C is between 0 and 7.9 and within 180 days    Hemoglobin A1C  Date Value Ref Range Status  07/03/2016 8.4  Final   HB A1C (BAYER DCA - WAIVED)  Date Value Ref Range Status  12/03/2021 10.0 (H) 4.8 - 5.6 % Final    Comment:             Prediabetes: 5.7 - 6.4          Diabetes: >6.4          Glycemic control for adults with diabetes: <7.0          Passed - Valid encounter within last 6 months    Recent Outpatient Visits          3 days ago Type 1 diabetes mellitus with other oral complication ([place])   [facility] Vigg, Avanti, MD   11 months ago Type 1 diabetes mellitus with other oral complication ([place])   [place], Megan P, DO   1 year ago Type 1 diabetes mellitus with other oral complication ([place])   [place], Megan P, DO   3 years ago Type 1 diabetes mellitus with other oral complication ([place])   [place], Jolene T, NP   4 years ago Type 1 diabetes mellitus with other oral complication [facility])   [facility] [place], Jeannette How, MD

## 2021-12-11 ENCOUNTER — Telehealth: Payer: Self-pay | Admitting: Pharmacist

## 2021-12-11 NOTE — Telephone Encounter (Signed)
12/11/2021 9:52:28 AM - Novolog Flexpen & tips Forms to Dr & Patient  -- Dustin Ray - Wednesday, December 11, 2021 9:50 AM --  Printed forms for Novolog Flexpen 100 Unit/ML & Needle Tips. Sending forms to Dr. Loman Brooklyn Vigg @ [facility] for Signature. Also sending patient forms for signature and requesting POI. Mailing [facility] Recetification application along with forms.

## 2021-12-30 ENCOUNTER — Telehealth: Payer: Self-pay | Admitting: Internal Medicine

## 2021-12-30 NOTE — Telephone Encounter (Signed)
Copied from [place] [id number]. Topic: General - Call Back - No Documentation >> Dec 27, 2021  4:34 PM Erick Blinks wrote: Reason for CRM: Pt wants to discuss his lab results, please advise if [facility] may disclose.  [phone]

## 2021-12-30 NOTE — Telephone Encounter (Signed)
I dont see the labs back

## 2022-01-01 ENCOUNTER — Other Ambulatory Visit: Payer: Self-pay

## 2022-01-01 ENCOUNTER — Telehealth: Payer: Self-pay | Admitting: Pharmacist

## 2022-01-01 NOTE — Telephone Encounter (Signed)
01/01/2022 11:04:17 AM - Novolog Flexpen & tips pending -- Arletha Pili - Wednesday, January 01, 2022 11:03 AM -- Received dr signed portion. Holding for pt portion, POI & taxes.

## 2022-01-03 ENCOUNTER — Other Ambulatory Visit: Payer: Self-pay

## 2022-01-03 ENCOUNTER — Encounter: Payer: Self-pay | Admitting: Internal Medicine

## 2022-01-08 ENCOUNTER — Other Ambulatory Visit: Payer: Self-pay

## 2022-01-10 ENCOUNTER — Other Ambulatory Visit: Payer: Self-pay

## 2022-01-10 MED ORDER — INSULIN PEN NEEDLE 31G X 8 MM MISC
99 refills | Status: AC
  Filled 2022-01-10: qty 100, 25d supply, fill #0

## 2022-01-13 ENCOUNTER — Other Ambulatory Visit: Payer: Self-pay

## 2022-01-22 ENCOUNTER — Telehealth: Payer: Self-pay | Admitting: Pharmacist

## 2022-01-22 NOTE — Telephone Encounter (Signed)
01/22/2022 2:36:51 PM - Novolog Flexpen & tips pending ?-- Arletha Pili - Wednesday, January 22, 2022 2:30 PM --  ?Holding for POI. Received pt's signed portion & 2022 w-2, however pt did turn in income. Called pt and he informed me that he gets paid weekly. Informed pt that I need feb. 2023 paystubs before I could order his Novolog. Pt verbalized that he understood and would get them to [facility] asap.  ? ?

## 2022-01-30 ENCOUNTER — Other Ambulatory Visit: Payer: Self-pay

## 2022-02-19 ENCOUNTER — Other Ambulatory Visit: Payer: Self-pay

## 2022-03-07 ENCOUNTER — Other Ambulatory Visit: Payer: Self-pay | Admitting: Family Medicine

## 2022-03-07 ENCOUNTER — Other Ambulatory Visit: Payer: Self-pay

## 2022-03-26 ENCOUNTER — Other Ambulatory Visit: Payer: Self-pay | Admitting: *Deleted

## 2022-03-26 ENCOUNTER — Other Ambulatory Visit: Payer: Self-pay

## 2022-03-26 MED ORDER — TOUJEO SOLOSTAR 300 UNIT/ML ~~LOC~~ SOPN
36.0000 [IU] | PEN_INJECTOR | Freq: Every day | SUBCUTANEOUS | 2 refills | Status: AC
  Filled 2022-03-26: qty 4.5, 38d supply, fill #0

## 2022-03-26 NOTE — Telephone Encounter (Signed)
?  Chief Complaint: Needs Toujeo ?Symptoms:  ?Frequency: "Been out for a week." ?Pertinent Negatives: Patient denies  ? ?Disposition: [] ED /[] Urgent Care (no appt availability in office) / [] Appointment(In office/virtual)/ []  [place] [facility]/ [] Home Care/ [] Refused Recommended Disposition /[] [facility] [facility]/ [x]  Follow-up with PCP ?Additional Notes: Pt with angry affect. States he has called regarding refill of Toujeo insulin ? ?

## 2022-03-26 NOTE — Telephone Encounter (Signed)
Requested Prescriptions  ?Pending Prescriptions Disp Refills  ?? insulin glargine, 1 Unit Dial, (TOUJEO SOLOSTAR) 300 UNIT/ML Solostar Pen 1.5 mL 2  ?  Sig: Inject 36 Units into the skin once daily at bedtime.  ?  ? Endocrinology:  Diabetes - Insulins Failed - 03/26/2022  9:30 AM  ?  ?  Failed - HBA1C is between 0 and 7.9 and within 180 days  ?  Hemoglobin A1C  ?Date Value Ref Range Status  ?07/03/2016 8.4  Final  ? ?HB A1C (BAYER DCA - WAIVED)  ?Date Value Ref Range Status  ?12/03/2021 10.0 (H) 4.8 - 5.6 % Final  ?  Comment:  ?           Prediabetes: 5.7 - 6.4 ?         Diabetes: >6.4 ?         Glycemic control for adults with diabetes: <7.0 ?  ?   ?  ?  Passed - Valid encounter within last 6 months  ?  Recent Outpatient Visits   ?      ? 3 months ago Type 1 diabetes mellitus with other oral complication ([place])  ? [facility] Vigg, Avanti, MD  ? 1 year ago Type 1 diabetes mellitus with other oral complication ([place])  ? [place], [place] P, DO  ? 2 years ago Type 1 diabetes mellitus with other oral complication ([place])  ? [place], [place] P, DO  ? 3 years ago Type 1 diabetes mellitus with other oral complication ([place])  ? [place], [place] T, NP  ? 4 years ago Type 1 diabetes mellitus with other oral complication ([place])  ? [facility] [place], Jeannette How, MD  ?  ?  ? ?  ?  ?  ? ? ?

## 2022-03-28 ENCOUNTER — Other Ambulatory Visit: Payer: Self-pay

## 2022-04-02 ENCOUNTER — Telehealth: Payer: Self-pay | Admitting: Pharmacy Technician

## 2022-04-02 NOTE — Telephone Encounter (Signed)
Received updated proof of income.  Patient eligible to receive medication assistance at [facility] until time for re-certification in 2024, and as long as eligibility requirements continue to be met. ? ?Dustin Ray J. Senia Even ?Care Manager ?[facility]  ?

## 2022-04-16 ENCOUNTER — Other Ambulatory Visit: Payer: Self-pay

## 2022-04-16 ENCOUNTER — Telehealth: Payer: Self-pay | Admitting: Pharmacy Technician

## 2022-04-16 NOTE — Telephone Encounter (Signed)
Patient stated that he now has prescription drug coverage with [facility].  Is no longer seeing [facility].  Is seeing a provider at [place],  Jacquelynn Cree Patient Advocate Specialist [place] at [facility]

## 2022-04-28 ENCOUNTER — Other Ambulatory Visit: Payer: Self-pay

## 2022-04-29 ENCOUNTER — Other Ambulatory Visit: Payer: Self-pay

## 2022-04-30 ENCOUNTER — Other Ambulatory Visit: Payer: Self-pay

## 2022-04-30 MED ORDER — INSULIN ASPART 100 UNIT/ML FLEXPEN: PEN_INJECTOR | SUBCUTANEOUS | 3 refills | Status: AC

## 2022-04-30 MED ORDER — NOVOFINE PEN NEEDLE 32G X 6 MM MISC: 3 refills | Status: AC

## 2022-05-08 ENCOUNTER — Other Ambulatory Visit: Payer: Self-pay
